# Patient Record
Sex: Female | Born: 1985 | Race: White | Hispanic: No | Marital: Single | Smoking: Current every day smoker
Health system: Southern US, Community
[De-identification: ages and names within clinical notes are randomized; demographics above are authoritative.]

## PROBLEM LIST (undated history)

## (undated) DIAGNOSIS — F4310 Post-traumatic stress disorder, unspecified: Secondary | ICD-10-CM

## (undated) DIAGNOSIS — Z87442 Personal history of urinary calculi: Secondary | ICD-10-CM

## (undated) DIAGNOSIS — N200 Calculus of kidney: Secondary | ICD-10-CM

## (undated) DIAGNOSIS — K5792 Diverticulitis of intestine, part unspecified, without perforation or abscess without bleeding: Secondary | ICD-10-CM

## (undated) DIAGNOSIS — F411 Generalized anxiety disorder: Secondary | ICD-10-CM

## (undated) HISTORY — DX: Diverticulitis of intestine, part unspecified, without perforation or abscess without bleeding: K57.92

## (undated) HISTORY — DX: Post-traumatic stress disorder, unspecified: F43.10

## (undated) HISTORY — DX: Generalized anxiety disorder: F41.1

## (undated) HISTORY — DX: Calculus of kidney: N20.0

---
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
## 2011-03-18 ENCOUNTER — Emergency Department: Payer: Self-pay | Admitting: Emergency Medicine

## 2011-03-18 LAB — URINALYSIS, COMPLETE
Bacteria: NONE SEEN
Ketone: NEGATIVE
Leukocyte Esterase: NEGATIVE
Nitrite: NEGATIVE
Protein: NEGATIVE
Specific Gravity: 1.02 (ref 1.003–1.030)
WBC UR: 1 /HPF (ref 0–5)

## 2011-03-19 LAB — CBC
HCT: 40.7 % (ref 35.0–47.0)
HGB: 13.6 g/dL (ref 12.0–16.0)
MCH: 31.1 pg (ref 26.0–34.0)
MCV: 93 fL (ref 80–100)
Platelet: 270 10*3/uL (ref 150–440)
RBC: 4.39 10*6/uL (ref 3.80–5.20)
RDW: 12.9 % (ref 11.5–14.5)

## 2011-03-24 ENCOUNTER — Emergency Department: Payer: Self-pay | Admitting: Emergency Medicine

## 2011-03-24 LAB — URINALYSIS, COMPLETE
Bilirubin,UR: NEGATIVE
Ketone: NEGATIVE
Leukocyte Esterase: NEGATIVE
Ph: 8 (ref 4.5–8.0)
RBC,UR: 2 /HPF (ref 0–5)
Specific Gravity: 1.004 (ref 1.003–1.030)
Squamous Epithelial: 2

## 2011-03-24 LAB — COMPREHENSIVE METABOLIC PANEL
Albumin: 4.1 g/dL (ref 3.4–5.0)
Alkaline Phosphatase: 56 U/L (ref 50–136)
BUN: 6 mg/dL — ABNORMAL LOW (ref 7–18)
Bilirubin,Total: 0.3 mg/dL (ref 0.2–1.0)
Calcium, Total: 9.3 mg/dL (ref 8.5–10.1)
Co2: 26 mmol/L (ref 21–32)
EGFR (African American): >60
Glucose: 89 mg/dL (ref 65–99)
Potassium: 3.6 mmol/L (ref 3.5–5.1)
SGOT(AST): 10 U/L — ABNORMAL LOW (ref 15–37)
Sodium: 138 mmol/L (ref 136–145)

## 2011-03-24 LAB — CBC
MCH: 31 pg (ref 26.0–34.0)
MCHC: 33.7 g/dL (ref 32.0–36.0)
MCV: 92 fL (ref 80–100)
Platelet: 227 10*3/uL (ref 150–440)
RBC: 4.11 10*6/uL (ref 3.80–5.20)
RDW: 13.1 % (ref 11.5–14.5)
WBC: 8.4 10*3/uL (ref 3.6–11.0)

## 2011-03-24 LAB — RAPID INFLUENZA A&B ANTIGENS

## 2011-03-24 LAB — LIPASE, BLOOD: Lipase: 83 U/L (ref 73–393)

## 2011-07-15 ENCOUNTER — Emergency Department: Payer: Self-pay

## 2016-09-06 ENCOUNTER — Encounter: Payer: Self-pay | Admitting: Emergency Medicine

## 2016-09-06 ENCOUNTER — Emergency Department
Admission: EM | Admit: 2016-09-06 | Discharge: 2016-09-06 | Disposition: A | Discharge status: Home / Self Care | Payer: Medicaid Other | Attending: Emergency Medicine | Admitting: Emergency Medicine

## 2016-09-06 DIAGNOSIS — T7840XA Allergy, unspecified, initial encounter: Secondary | ICD-10-CM | POA: Insufficient documentation

## 2016-09-06 DIAGNOSIS — Z888 Allergy status to other drugs, medicaments and biological substances status: Secondary | ICD-10-CM

## 2016-09-06 DIAGNOSIS — Z882 Allergy status to sulfonamides status: Secondary | ICD-10-CM | POA: Insufficient documentation

## 2016-09-06 DIAGNOSIS — F17200 Nicotine dependence, unspecified, uncomplicated: Secondary | ICD-10-CM | POA: Insufficient documentation

## 2016-09-06 DIAGNOSIS — L509 Urticaria, unspecified: Secondary | ICD-10-CM | POA: Diagnosis not present

## 2016-09-06 MED ORDER — DIPHENHYDRAMINE HCL 25 MG PO CAPS
ORAL_CAPSULE | ORAL | Status: AC
  Filled 2016-09-06: qty 2

## 2016-09-06 MED ORDER — FAMOTIDINE 20 MG PO TABS: 20.0000 mg | ORAL_TABLET | Freq: Two times a day (BID) | ORAL | 1 refills | Status: DC

## 2016-09-06 MED ORDER — EPINEPHRINE 0.3 MG/0.3ML IJ SOAJ: 0.3000 mg | Freq: Once | INTRAMUSCULAR | 0 refills | Status: AC

## 2016-09-06 MED ORDER — DIPHENHYDRAMINE HCL 25 MG PO CAPS
50.0000 mg | ORAL_CAPSULE | Freq: Once | ORAL | Status: AC
  Administered 2016-09-06: 50 mg via ORAL

## 2016-09-06 MED ORDER — PREDNISONE 20 MG PO TABS: 60.0000 mg | ORAL_TABLET | Freq: Every day | ORAL | 0 refills | Status: DC

## 2016-09-06 MED ORDER — METHYLPREDNISOLONE SODIUM SUCC 125 MG IJ SOLR
125.0000 mg | Freq: Once | INTRAMUSCULAR | Status: AC
  Administered 2016-09-06: 125 mg via INTRAVENOUS
  Filled 2016-09-06: qty 2

## 2016-09-06 MED ORDER — FAMOTIDINE IN NACL 20-0.9 MG/50ML-% IV SOLN
20.0000 mg | Freq: Once | INTRAVENOUS | Status: AC
  Administered 2016-09-06: 20 mg via INTRAVENOUS
  Filled 2016-09-06: qty 50

## 2016-09-06 NOTE — ED Triage Notes (Signed)
Pt with rash to all over since yesterday, states is burning and itching. Started on some new meds two weeks ago, bactrim, buspar, trazadone, kenelog and mupirocin. Has been using benedryl with no relief. No resp distress noted.

## 2016-09-06 NOTE — ED Provider Notes (Addendum)
[facility] Emergency Department Provider Note  ____________________________________________   I have reviewed the triage vital signs and the nursing notes.   HISTORY  Chief Complaint Allergic Reaction and Rash    HPI Toni Salazar is a 31 y.o. female was loosely started on a large number of new medications including Kenalog, mupirocin, Bactrim, trazodone, BuSpar,who is been all of all of these medications with intermittent frequency for 2 weeks. She had a possible infection on her hands she was being treated with Bactrim. Last took Bactrim yesterday. Yesterday she began having a rash which was hives diffusely. She denies any tongue swelling or respiratory issues. No throat closure or etc. She is using the trazodone daily at bedtime, she is taking the BuSpar as needed for anxiety and hasn't taken it recently, Kenalog she is not recently used and the mupirocin she is not recently as she has been consistently taking the Bactrim however. Patient does have a history of allergic reactions to penicillin with hives as well.   History reviewed. No pertinent past medical history.  There are no active problems to display for this patient.   History reviewed. No pertinent surgical history.  Prior to Admission medications   Not on File    Allergies Amoxicillin  No family history on file.  Social History Social History  Substance Use Topics  . Smoking status: Current Every Day Smoker  . Smokeless tobacco: Never Used  . Alcohol use Yes    Review of Systems Constitutional: No fever/chills Eyes: No visual changes. ENT: No sore throat. No stiff neck no neck pain Cardiovascular: Denies chest pain. Respiratory: Denies shortness of breath. Gastrointestinal:   no vomiting.  No diarrhea.  No constipation. Genitourinary: Negative for dysuria. Musculoskeletal: Negative lower extremity swelling Skin: Positive for rash. Neurological: Negative for severe headaches,  focal weakness or numbness.   ____________________________________________   PHYSICAL EXAM:  VITAL SIGNS: ED Triage Vitals [09/06/16 1002]  Enc Vitals Group     BP (!) 103/49     Pulse Rate (!) 118     Resp 20     Temp 99.2 F (37.3 C)     Temp Source Oral     SpO2 97 %     Weight      Height      Head Circumference      Peak Flow      Pain Score 6     Pain Loc      Pain Edu?      Excl. in GC?     Constitutional: Alert and oriented. Well appearing and in no acute distress.Anxious and upset Eyes: Conjunctivae are normal Head: Atraumatic HEENT: No congestion/rhinnorhea. Mucous membranes are moist.  Oropharynx non-erythematous Neck:   Nontender with no meningismus, no masses, no stridor Cardiovascular: Normal rate, regular rhythm. Grossly normal heart sounds.  Good peripheral circulation. Respiratory: Normal respiratory effort.  No retractions. Lungs CTAB. Abdominal: Soft and nontender. No distention. No guarding no rebound Back:  There is no focal tenderness or step off.  there is no midline tenderness there are no lesions noted. there is no CVA tenderness Musculoskeletal: No lower extremity tenderness, no upper extremity tenderness. No joint effusions, no DVT signs strong distal pulses no edema Neurologic:  Normal speech and language. No gross focal neurologic deficits are appreciated.  Skin:  Skin is warm, dry and intact. Diffuse hives noted Psychiatric: Mood and affect are normal. Speech and behavior are normal.  ____________________________________________   LABS (all labs ordered  are listed, but only abnormal results are displayed)  Labs Reviewed - No data to display ____________________________________________  EKG  I personally interpreted any EKGs ordered by me or triage  ____________________________________________  RADIOLOGY  I reviewed any imaging ordered by me or triage that were performed during my shift and, if possible, patient and/or family made  aware of any abnormal findings. ____________________________________________   PROCEDURES  Procedure(s) performed: None  Procedures  Critical Care performed: None  ____________________________________________   INITIAL IMPRESSION / ASSESSMENT AND PLAN / ED COURSE  Pertinent labs & imaging results that were available during my care of the patient were reviewed by me and considered in my medical decision making (see chart for details).  Patient here with hives after starting a large number of new medications be most likely this is the Bactrim, however she is also taking trazodone daily at bedtime and BuSpar when necessary anxiety. Hasn't taken it was primed several days however. We will advise that she stop these new medications and follow closely with her doctor we'll treat her with steroids and antihistamines and we'll reassess.   ----------------------------------------- 2:41 PM on 09/06/2016 -----------------------------------------  All symptoms have resolved patient has no evidence of progression no evidence of anaphylaxis we will have her follow closely with her doctor and I have recommended follow up also with an allergist. Patient in no acute distress. We have advised that she stop all new medications. Most likely this is the Bactrim she is understanding that she has a sulfa allergy but I have advised her to stop all new medications until she can see her doctor and possibly have some reintroduced as needed. None of these are essential medications. Patient's very understanding and in agreement with this plan.    ____________________________________________   FINAL CLINICAL IMPRESSION(S) / ED DIAGNOSES  Final diagnoses:  None      This chart was dictated using voice recognition software.  Despite best efforts to proofread,  errors can occur which can change meaning.      Jeanmarie PlantMcShane, James A, MD 09/06/16 1224    Jeanmarie PlantMcShane, James A, MD 09/06/16 [phone]

## 2016-09-06 NOTE — ED Notes (Addendum)
Pt has broken out in hives all over her body (started Saturday) - she started new medication 2 weeks ago (Bactrim DS, Buspar, and trazodone) - pt c/o itching - pt appears anxious but is no acute respiratory distress - no cough noted and respirations are even and unlabored

## 2016-09-06 NOTE — Discharge Instructions (Signed)
Most likely this is a reaction to the sulfa antibiotic, you are now officially allergic to sulfa antibiotics until proven otherwise. However, we cannot know for sure if you're allergic to any of the other new medications he started including BuSpar, trazodone, and those creams. Any new medications that you have had the last week he must stop taking until he can see her doctor. We would strongly advise you to follow closely with your primary care doctor tomorrow as well as follow closely with an allergist by their recommendation.

## 2017-04-28 ENCOUNTER — Encounter: Payer: Self-pay | Admitting: Emergency Medicine

## 2017-04-28 ENCOUNTER — Other Ambulatory Visit: Payer: Self-pay

## 2017-04-28 ENCOUNTER — Observation Stay
Admission: EM | Admit: 2017-04-28 | Discharge: 2017-04-30 | Disposition: A | Discharge status: Home / Self Care | Payer: Medicaid Other | Attending: Urology | Admitting: Urology

## 2017-04-28 ENCOUNTER — Emergency Department: Payer: Medicaid Other

## 2017-04-28 DIAGNOSIS — N200 Calculus of kidney: Secondary | ICD-10-CM | POA: Diagnosis present

## 2017-04-28 DIAGNOSIS — Z882 Allergy status to sulfonamides status: Secondary | ICD-10-CM | POA: Diagnosis not present

## 2017-04-28 DIAGNOSIS — R319 Hematuria, unspecified: Secondary | ICD-10-CM

## 2017-04-28 DIAGNOSIS — N390 Urinary tract infection, site not specified: Secondary | ICD-10-CM | POA: Diagnosis not present

## 2017-04-28 DIAGNOSIS — Z880 Allergy status to penicillin: Secondary | ICD-10-CM | POA: Diagnosis not present

## 2017-04-28 DIAGNOSIS — N201 Calculus of ureter: Secondary | ICD-10-CM | POA: Diagnosis present

## 2017-04-28 DIAGNOSIS — N132 Hydronephrosis with renal and ureteral calculous obstruction: Secondary | ICD-10-CM | POA: Diagnosis not present

## 2017-04-28 DIAGNOSIS — F17200 Nicotine dependence, unspecified, uncomplicated: Secondary | ICD-10-CM | POA: Insufficient documentation

## 2017-04-28 DIAGNOSIS — R109 Unspecified abdominal pain: Secondary | ICD-10-CM

## 2017-04-28 LAB — COMPREHENSIVE METABOLIC PANEL
ALK PHOS: 80 U/L (ref 38–126)
ALT: 19 U/L (ref 14–54)
AST: 21 U/L (ref 15–41)
Albumin: 4.5 g/dL (ref 3.5–5.0)
Anion gap: 10 (ref 5–15)
BUN: 17 mg/dL (ref 6–20)
CALCIUM: 9.2 mg/dL (ref 8.9–10.3)
CO2: 24 mmol/L (ref 22–32)
Chloride: 105 mmol/L (ref 101–111)
Creatinine, Ser: 0.85 mg/dL (ref 0.44–1.00)
GFR calc Af Amer: >60 mL/min (ref >60)
GFR calc non Af Amer: >60 mL/min (ref >60)
Glucose, Bld: 126 mg/dL — ABNORMAL HIGH (ref 65–99)
Potassium: 3.9 mmol/L (ref 3.5–5.1)
SODIUM: 139 mmol/L (ref 135–145)
TOTAL PROTEIN: 7.9 g/dL (ref 6.5–8.1)
Total Bilirubin: 0.9 mg/dL (ref 0.3–1.2)

## 2017-04-28 LAB — CBC WITH DIFFERENTIAL/PLATELET
Basophils Absolute: 0.1 10*3/uL (ref 0–0.1)
Basophils Relative: 0 %
EOS ABS: 0.1 10*3/uL (ref 0–0.7)
EOS PCT: 0 %
HCT: 42.6 % (ref 35.0–47.0)
HEMOGLOBIN: 14.2 g/dL (ref 12.0–16.0)
LYMPHS ABS: 1.7 10*3/uL (ref 1.0–3.6)
Lymphocytes Relative: 9 %
MCH: 30.3 pg (ref 26.0–34.0)
MCHC: 33.4 g/dL (ref 32.0–36.0)
MCV: 90.5 fL (ref 80.0–100.0)
MONO ABS: 1.3 10*3/uL — AB (ref 0.2–0.9)
Monocytes Relative: 6 %
Neutro Abs: 17.5 10*3/uL — ABNORMAL HIGH (ref 1.4–6.5)
Neutrophils Relative %: 85 %
PLATELETS: 309 10*3/uL (ref 150–440)
RBC: 4.71 MIL/uL (ref 3.80–5.20)
RDW: 13.9 % (ref 11.5–14.5)
WBC: 20.6 10*3/uL — ABNORMAL HIGH (ref 3.6–11.0)

## 2017-04-28 LAB — POCT PREGNANCY, URINE: PREG TEST UR: NEGATIVE

## 2017-04-28 LAB — LIPASE, BLOOD: Lipase: 22 U/L (ref 11–51)

## 2017-04-28 LAB — HCG, QUANTITATIVE, PREGNANCY: hCG, Beta Chain, Quant, S: <1 m[IU]/mL (ref <5)

## 2017-04-28 MED ORDER — KETOROLAC TROMETHAMINE 30 MG/ML IJ SOLN
30.0000 mg | Freq: Once | INTRAMUSCULAR | Status: AC
  Administered 2017-04-28: 30 mg via INTRAVENOUS
  Filled 2017-04-28: qty 1

## 2017-04-28 MED ORDER — SODIUM CHLORIDE 0.9 % IV BOLUS (SEPSIS)
1000.0000 mL | Freq: Once | INTRAVENOUS | Status: AC
  Administered 2017-04-28: 1000 mL via INTRAVENOUS

## 2017-04-28 MED ORDER — ONDANSETRON HCL 4 MG/2ML IJ SOLN
4.0000 mg | Freq: Once | INTRAMUSCULAR | Status: AC
  Administered 2017-04-28: 4 mg via INTRAVENOUS
  Filled 2017-04-28: qty 2

## 2017-04-28 MED ORDER — MORPHINE SULFATE (PF) 4 MG/ML IV SOLN
4.0000 mg | Freq: Once | INTRAVENOUS | Status: AC
  Administered 2017-04-28: 4 mg via INTRAVENOUS
  Filled 2017-04-28: qty 1

## 2017-04-28 NOTE — ED Provider Notes (Signed)
[facility] Emergency Department Provider Note  ____________________________________________   First MD Initiated Contact with Patient 04/28/17 2210     (approximate)  I have reviewed the triage vital signs and the nursing notes.   HISTORY  Chief Complaint Flank Pain   HPI Toni Salazar is a 32 y.o. female with a history of "painful."  Was presented to the emergency department with right-sided flank pain since 430 this afternoon.  The patient reports that the pain is a 10 out of 10 and sharp at this time.  She says that the pain is coming and going.  Says that she has vomited secondary to the pain.  Says that she is a family history of kidney stones but is never had a kidney stone herself.  Says that she also feels like she has to urinate but is unable to.  Denies any abdominal pain.  Says that she is currently on her period.  However, she says that her period pains are usually not as intense as she is experiencing at this time.  History reviewed. No pertinent past medical history.  There are no active problems to display for this patient.   Past Surgical History:  Procedure Laterality Date  . CESAREAN SECTION      Prior to Admission medications   Medication Sig Start Date End Date Taking? Authorizing Provider  famotidine (PEPCID) 20 MG tablet Take 1 tablet (20 mg total) by mouth 2 (two) times daily. 09/06/16 09/06/17  Jeanmarie PlantMcShane, James A, MD  predniSONE (DELTASONE) 20 MG tablet Take 3 tablets (60 mg total) by mouth daily. 09/06/16   Jeanmarie PlantMcShane, James A, MD    Allergies Amoxicillin and Sulfa antibiotics  No family history on file.  Social History Social History   Tobacco Use  . Smoking status: Current Every Day Smoker  . Smokeless tobacco: Never Used  Substance Use Topics  . Alcohol use: Yes  . Drug use: No    Review of Systems  Constitutional: No fever/chills Eyes: No visual changes. ENT: No sore throat. Cardiovascular: Denies chest  pain. Respiratory: Denies shortness of breath. Gastrointestinal: No abdominal pain.  No nausea, no vomiting.  No diarrhea.  No constipation. Genitourinary: Negative for dysuria. Musculoskeletal: As above Skin: Negative for rash. Neurological: Negative for headaches, focal weakness or numbness.   ____________________________________________   PHYSICAL EXAM:  VITAL SIGNS: ED Triage Vitals  Enc Vitals Group     BP 04/28/17 2204 117/89     Pulse Rate 04/28/17 2204 88     Resp 04/28/17 2204 (!) 22     Temp 04/28/17 2204 98 F (36.7 C)     Temp Source 04/28/17 2204 Oral     SpO2 04/28/17 2204 98 %     Weight 04/28/17 2203 190 lb (86.2 kg)     Height 04/28/17 2203 5\' 2"  (1.575 m)     Head Circumference --      Peak Flow --      Pain Score 04/28/17 2203 10     Pain Loc --      Pain Edu? --      Excl. in GC? --     Constitutional: Alert and oriented.  Patient hunched over the end of the bed.  She is standing on the floor with her arm outstretched on top of the mattress.  She appears very uncomfortable. Eyes: Conjunctivae are normal.  Head: Atraumatic. Nose: No congestion/rhinnorhea. Mouth/Throat: Mucous membranes are moist.  Neck: No stridor.   Cardiovascular: Normal rate,  regular rhythm. Grossly normal heart sounds.   Respiratory: Normal respiratory effort.  No retractions. Lungs CTAB. Gastrointestinal: Soft and nontender. No distention.  Minimal right-sided CVA tenderness to palpation. Musculoskeletal: No lower extremity tenderness nor edema.  No joint effusions. Neurologic:  Normal speech and language. No gross focal neurologic deficits are appreciated. Skin:  Skin is warm, dry and intact. No rash noted.  ____________________________________________   LABS (all labs ordered are listed, but only abnormal results are displayed)  Labs Reviewed  CBC WITH DIFFERENTIAL/PLATELET - Abnormal; Notable for the following components:      Result Value   WBC 20.6 (*)    Neutro Abs  17.5 (*)    Monocytes Absolute 1.3 (*)    All other components within normal limits  COMPREHENSIVE METABOLIC PANEL - Abnormal; Notable for the following components:   Glucose, Bld 126 (*)    All other components within normal limits  LIPASE, BLOOD  HCG, QUANTITATIVE, PREGNANCY  URINALYSIS, COMPLETE (UACMP) WITH MICROSCOPIC  POC URINE PREG, ED   ____________________________________________  EKG   ____________________________________________  RADIOLOGY  Pending CT renal ____________________________________________   PROCEDURES  Procedure(s) performed:   Procedures  Critical Care performed:   ____________________________________________   INITIAL IMPRESSION / ASSESSMENT AND PLAN / ED COURSE  Pertinent labs & imaging results that were available during my care of the patient were reviewed by me and considered in my medical decision making (see chart for details).  Differential diagnosis includes, but is not limited to, ovarian cyst, ovarian torsion, acute appendicitis, diverticulitis, urinary tract infection/pyelonephritis, endometriosis, bowel obstruction, colitis, renal colic, gastroenteritis, hernia, fibroids, endometriosis, pregnancy related pain including ectopic pregnancy, etc.  As part of my medical decision making, I reviewed the following data within the electronic MEDICAL RECORD NUMBER Notes from prior ED visits  ----------------------------------------- 11:59 PM on 04/28/2017 -----------------------------------------  Patient resting without distress at this time after IV pain meds.  Pending CT scan at this time as well as urinalysis.  Signed out to Dr. Derrill Kay.      ____________________________________________   FINAL CLINICAL IMPRESSION(S) / ED DIAGNOSES  Right flank pain.    NEW MEDICATIONS STARTED DURING THIS VISIT:  New Prescriptions   No medications on file     Note:  This document was prepared using Dragon voice recognition software and may  include unintentional dictation errors.     Myrna Blazer, MD 04/28/17 [phone]

## 2017-04-28 NOTE — ED Notes (Signed)
Patient transported to CT 

## 2017-04-28 NOTE — ED Triage Notes (Addendum)
Patient ambulatory to triage with steady gait, without difficulty, pt restless, tearful; pt reports right flank/abd pain this evening; currently menstruating with urinary urgency; denies hx of same

## 2017-04-29 ENCOUNTER — Emergency Department: Payer: Medicaid Other | Admitting: Anesthesiology

## 2017-04-29 ENCOUNTER — Encounter
Admission: EM | Disposition: A | Discharge status: Home / Self Care | Payer: Self-pay | Source: Home / Self Care | Attending: Emergency Medicine

## 2017-04-29 ENCOUNTER — Encounter: Payer: Self-pay | Admitting: Anesthesiology

## 2017-04-29 ENCOUNTER — Other Ambulatory Visit: Payer: Self-pay

## 2017-04-29 DIAGNOSIS — N201 Calculus of ureter: Secondary | ICD-10-CM

## 2017-04-29 DIAGNOSIS — R319 Hematuria, unspecified: Secondary | ICD-10-CM

## 2017-04-29 DIAGNOSIS — N390 Urinary tract infection, site not specified: Secondary | ICD-10-CM

## 2017-04-29 DIAGNOSIS — N132 Hydronephrosis with renal and ureteral calculous obstruction: Secondary | ICD-10-CM

## 2017-04-29 HISTORY — PX: CYSTOSCOPY WITH STENT PLACEMENT: SHX5790

## 2017-04-29 HISTORY — DX: Calculus of ureter: N20.1

## 2017-04-29 LAB — URINALYSIS, COMPLETE (UACMP) WITH MICROSCOPIC
Bacteria, UA: NONE SEEN
Bilirubin Urine: NEGATIVE
GLUCOSE, UA: NEGATIVE mg/dL
Ketones, ur: 5 mg/dL — AB
Leukocytes, UA: NEGATIVE
Nitrite: NEGATIVE
PH: 5 (ref 5.0–8.0)
PROTEIN: 30 mg/dL — AB
Specific Gravity, Urine: 1.024 (ref 1.005–1.030)

## 2017-04-29 SURGERY — CYSTOSCOPY, WITH STENT INSERTION
Anesthesia: General | Site: Ureter | Laterality: Right | Wound class: Clean Contaminated

## 2017-04-29 MED ORDER — FENTANYL CITRATE (PF) 100 MCG/2ML IJ SOLN
INTRAMUSCULAR | Status: DC | PRN
  Administered 2017-04-29 (×2): 50 ug via INTRAVENOUS

## 2017-04-29 MED ORDER — FENTANYL CITRATE (PF) 100 MCG/2ML IJ SOLN: 25.0000 ug | INTRAMUSCULAR | Status: DC | PRN

## 2017-04-29 MED ORDER — OXYCODONE-ACETAMINOPHEN 5-325 MG PO TABS
1.0000 | ORAL_TABLET | ORAL | Status: DC | PRN
  Administered 2017-04-29: 1 via ORAL
  Administered 2017-04-29 (×2): 2 via ORAL
  Administered 2017-04-29: 1 via ORAL
  Filled 2017-04-29 (×2): qty 1
  Filled 2017-04-29 (×2): qty 2

## 2017-04-29 MED ORDER — OXYBUTYNIN CHLORIDE 5 MG PO TABS: 5.0000 mg | ORAL_TABLET | Freq: Three times a day (TID) | ORAL | Status: DC | PRN

## 2017-04-29 MED ORDER — GENTAMICIN SULFATE 40 MG/ML IJ SOLN
5.0000 mg/kg | Freq: Once | INTRAVENOUS | Status: AC
  Administered 2017-04-30: 320 mg via INTRAVENOUS
  Filled 2017-04-29: qty 8

## 2017-04-29 MED ORDER — OXYBUTYNIN CHLORIDE 5 MG PO TABS: 5.0000 mg | ORAL_TABLET | Freq: Three times a day (TID) | ORAL | 0 refills | Status: DC | PRN

## 2017-04-29 MED ORDER — MORPHINE SULFATE (PF) 4 MG/ML IV SOLN
4.0000 mg | Freq: Once | INTRAVENOUS | Status: AC
  Administered 2017-04-29: 4 mg via INTRAVENOUS
  Filled 2017-04-29: qty 1

## 2017-04-29 MED ORDER — HYDROCODONE-ACETAMINOPHEN 5-325 MG PO TABS: 1.0000 | ORAL_TABLET | Freq: Four times a day (QID) | ORAL | 0 refills | Status: DC | PRN

## 2017-04-29 MED ORDER — TAMSULOSIN HCL 0.4 MG PO CAPS: 0.4000 mg | ORAL_CAPSULE | Freq: Every day | ORAL | 0 refills | Status: DC

## 2017-04-29 MED ORDER — BELLADONNA ALKALOIDS-OPIUM 16.2-60 MG RE SUPP: 1.0000 | Freq: Four times a day (QID) | RECTAL | Status: DC | PRN

## 2017-04-29 MED ORDER — SODIUM CHLORIDE 0.9 % IV SOLN
INTRAVENOUS | Status: DC | PRN
  Administered 2017-04-29: 02:00:00 via INTRAVENOUS

## 2017-04-29 MED ORDER — DOCUSATE SODIUM 100 MG PO CAPS: 100.0000 mg | ORAL_CAPSULE | Freq: Two times a day (BID) | ORAL | 0 refills | Status: DC

## 2017-04-29 MED ORDER — ACETAMINOPHEN 325 MG PO TABS: 650.0000 mg | ORAL_TABLET | ORAL | Status: DC | PRN

## 2017-04-29 MED ORDER — LIDOCAINE HCL (CARDIAC) 20 MG/ML IV SOLN
INTRAVENOUS | Status: DC | PRN
  Administered 2017-04-29: 50 mg via INTRAVENOUS

## 2017-04-29 MED ORDER — DIPHENHYDRAMINE HCL 12.5 MG/5ML PO ELIX
12.5000 mg | ORAL_SOLUTION | Freq: Four times a day (QID) | ORAL | Status: DC | PRN
  Filled 2017-04-29: qty 10

## 2017-04-29 MED ORDER — MIDAZOLAM HCL 2 MG/2ML IJ SOLN
INTRAMUSCULAR | Status: DC | PRN
  Administered 2017-04-29: 2 mg via INTRAVENOUS

## 2017-04-29 MED ORDER — ONDANSETRON HCL 4 MG/2ML IJ SOLN
INTRAMUSCULAR | Status: DC | PRN
  Administered 2017-04-29: 4 mg via INTRAVENOUS

## 2017-04-29 MED ORDER — MORPHINE SULFATE (PF) 2 MG/ML IV SOLN: 2.0000 mg | INTRAVENOUS | Status: DC | PRN

## 2017-04-29 MED ORDER — IOTHALAMATE MEGLUMINE 43 % IV SOLN
INTRAVENOUS | Status: DC | PRN
  Administered 2017-04-29: 9 mL via URETHRAL

## 2017-04-29 MED ORDER — PROPOFOL 10 MG/ML IV BOLUS
INTRAVENOUS | Status: DC | PRN
  Administered 2017-04-29: 200 mg via INTRAVENOUS

## 2017-04-29 MED ORDER — CIPROFLOXACIN HCL 500 MG PO TABS: 500.0000 mg | ORAL_TABLET | Freq: Two times a day (BID) | ORAL | 0 refills | Status: DC

## 2017-04-29 MED ORDER — PROPOFOL 10 MG/ML IV BOLUS
INTRAVENOUS | Status: AC
  Filled 2017-04-29: qty 20

## 2017-04-29 MED ORDER — ONDANSETRON HCL 4 MG/2ML IJ SOLN: 4.0000 mg | Freq: Once | INTRAMUSCULAR | Status: DC | PRN

## 2017-04-29 MED ORDER — DEXAMETHASONE SODIUM PHOSPHATE 10 MG/ML IJ SOLN
INTRAMUSCULAR | Status: DC | PRN
  Administered 2017-04-29: 10 mg via INTRAVENOUS

## 2017-04-29 MED ORDER — CIPROFLOXACIN IN D5W 400 MG/200ML IV SOLN
400.0000 mg | Freq: Once | INTRAVENOUS | Status: AC
  Administered 2017-04-29: 400 mg via INTRAVENOUS
  Filled 2017-04-29: qty 200

## 2017-04-29 MED ORDER — PROPOFOL 500 MG/50ML IV EMUL
INTRAVENOUS | Status: AC
  Filled 2017-04-29: qty 50

## 2017-04-29 MED ORDER — HEPARIN SODIUM (PORCINE) 5000 UNIT/ML IJ SOLN
5000.0000 [IU] | Freq: Three times a day (TID) | INTRAMUSCULAR | Status: DC
  Filled 2017-04-29: qty 1

## 2017-04-29 MED ORDER — SODIUM CHLORIDE 0.9 % IV SOLN
INTRAVENOUS | Status: DC
  Administered 2017-04-29 – 2017-04-30 (×4): via INTRAVENOUS

## 2017-04-29 MED ORDER — GENTAMICIN SULFATE 40 MG/ML IJ SOLN
5.0000 mg/kg | Freq: Once | INTRAVENOUS | Status: AC
  Administered 2017-04-29: 320 mg via INTRAVENOUS
  Filled 2017-04-29: qty 8

## 2017-04-29 MED ORDER — SENNOSIDES-DOCUSATE SODIUM 8.6-50 MG PO TABS: 1.0000 | ORAL_TABLET | Freq: Every evening | ORAL | Status: DC | PRN

## 2017-04-29 MED ORDER — SUCCINYLCHOLINE CHLORIDE 20 MG/ML IJ SOLN
INTRAMUSCULAR | Status: DC | PRN
  Administered 2017-04-29: 140 mg via INTRAVENOUS

## 2017-04-29 MED ORDER — FENTANYL CITRATE (PF) 100 MCG/2ML IJ SOLN
INTRAMUSCULAR | Status: AC
  Filled 2017-04-29: qty 2

## 2017-04-29 MED ORDER — DIPHENHYDRAMINE HCL 50 MG/ML IJ SOLN: 12.5000 mg | Freq: Four times a day (QID) | INTRAMUSCULAR | Status: DC | PRN

## 2017-04-29 MED ORDER — ONDANSETRON HCL 4 MG/2ML IJ SOLN: 4.0000 mg | INTRAMUSCULAR | Status: DC | PRN

## 2017-04-29 MED ORDER — DOCUSATE SODIUM 100 MG PO CAPS
100.0000 mg | ORAL_CAPSULE | Freq: Two times a day (BID) | ORAL | Status: DC
  Administered 2017-04-29 (×2): 100 mg via ORAL
  Filled 2017-04-29 (×2): qty 1

## 2017-04-29 MED ORDER — MIDAZOLAM HCL 2 MG/2ML IJ SOLN
INTRAMUSCULAR | Status: AC
  Filled 2017-04-29: qty 2

## 2017-04-29 SURGICAL SUPPLY — 22 items
BAG DRAIN CYSTO-URO LG1000N (MISCELLANEOUS) ×3 IMPLANT
BRUSH SCRUB EZ  4% CHG (MISCELLANEOUS) ×2
BRUSH SCRUB EZ 4% CHG (MISCELLANEOUS) ×1 IMPLANT
CATH URETL 5X70 OPEN END (CATHETERS) ×3 IMPLANT
CONRAY 43 FOR UROLOGY 50M (MISCELLANEOUS) ×3 IMPLANT
GLOVE BIO SURGEON STRL SZ 6.5 (GLOVE) ×8 IMPLANT
GLOVE BIO SURGEONS STRL SZ 6.5 (GLOVE) ×4
GOWN STRL REUS W/ TWL LRG LVL3 (GOWN DISPOSABLE) ×2 IMPLANT
GOWN STRL REUS W/TWL LRG LVL3 (GOWN DISPOSABLE) ×4
KIT TURNOVER CYSTO (KITS) ×3 IMPLANT
PACK CYSTO AR (MISCELLANEOUS) ×3 IMPLANT
SENSORWIRE 0.038 NOT ANGLED (WIRE) ×3
SET CYSTO W/LG BORE CLAMP LF (SET/KITS/TRAYS/PACK) ×3 IMPLANT
SOL .9 NS 3000ML IRR  AL (IV SOLUTION) ×2
SOL .9 NS 3000ML IRR UROMATIC (IV SOLUTION) ×1 IMPLANT
STENT URET 6FRX22 CONTOUR (STENTS) ×3 IMPLANT
STENT URET 6FRX24 CONTOUR (STENTS) IMPLANT
STENT URET 6FRX26 CONTOUR (STENTS) IMPLANT
SURGILUBE 2OZ TUBE FLIPTOP (MISCELLANEOUS) ×3 IMPLANT
SYRINGE IRR TOOMEY STRL 70CC (SYRINGE) ×3 IMPLANT
WATER STERILE IRR 1000ML POUR (IV SOLUTION) ×3 IMPLANT
WIRE SENSOR 0.038 NOT ANGLED (WIRE) ×1 IMPLANT

## 2017-04-29 NOTE — Op Note (Signed)
Date of procedure: 04/29/17  Preoperative diagnosis:  1. Right UVJ stone 2. Right hydronephrosis 3. UTI  Postoperative diagnosis:  1. Same as above  Procedure: 1. Cystoscopy 2. Right retrograde pyelogram 3. Right ureteral stent placement  Surgeon: Vanna ScotlandAshley Dewanna Hurston, MD  Anesthesia: General  Complications: None  Intraoperative findings: Hydroureteronephrosis down to the level of the UVJ and retrograde pyelogram.  Stent placed without difficulty.  EBL: minmal  Specimens: None  Drains: 6 x 22 French double-J ureteral stent on right  Indication: Toni Salazar is a 32 y.o. patient with an obstructing right UVJ stone, leukocytosis, and positive urinalysis all concerning for infection.  She was counseled to undergo emergent ureteral stent placement..  After reviewing the management options for treatment, she elected to proceed with the above surgical procedure(s). We have discussed the potential benefits and risks of the procedure, side effects of the proposed treatment, the likelihood of the patient achieving the goals of the procedure, and any potential problems that might occur during the procedure or recuperation. Informed consent has been obtained.  Description of procedure:  The patient was taken to the operating room and general anesthesia was induced.  The patient was placed in the dorsal lithotomy position, prepped and draped in the usual sterile fashion, and preoperative antibiotics were administered. A preoperative time-out was performed.   A 21 French scope was advanced per urethra into the bladder.  Attention was turned to the right UVJ which is noted to be mildly 5 [place]French open-ended ureteral catheter was advanced just within the mouth of the UO and a very gentle retrograde pyelogram was performed using only approximately 8 cc of contrast.  This revealed hydroureteronephrosis with a transition point at the UVJ.  The stone itself was not easily seen on scout imaging.  A sensor  wire was then advanced up to level of the kidney.  A 6 x 22 French double-J ureteral stent was then advanced over the wire to the level of the kidney.  The wire was partially drawn and a full coils noted within the renal pelvis.  The wire was then fully withdrawn a focal was noted within the bladder.  The bladder was then drained.  She was then clean and dry, repositioned in supine position, reversed from anesthesia, and taken to PACU in stable condition.  She remained hemodynamically stable throughout the procedure.  Plan: She will be admitted for IV antibiotics and observation.  If she remains afebrile, she will be discharged within 24 hours.  Vanna ScotlandAshley Jarrett Albor, M.D.

## 2017-04-29 NOTE — H&P (Signed)
Urology Consult  I have been asked to see the patient by Dr. Derrill Kay, for evaluation and management of right UVJ stone.  Chief Complaint: right flank pain  History of Present Illness: Toni Salazar is a 32 y.o. year old presenting to the emergency room overnight with severe onset sudden right flank pain which was 10/10 along with chills, nausea and vomiting.  She also had a sensation of needing to void but was unable to do so.  No dysuria.  She is currently menstruating so is difficult to assess whether or not she is had gross hematuria.  As part of the workup, noncontrast CT scan was performed which showed a 4 mm right UVJ stone, a small nonobstructing stone on the same side, hydronephrosis with nephromegaly and perinephric stranding.  She also has a leukocytosis to 20 as well as a suspicious appearing urine with too numerous to count red blood cells and red white blood cells.  She is otherwise afebrile and hemodynamically stable.  No personal history of kidney stones.  History reviewed. No pertinent past medical history.  Past Surgical History:  Procedure Laterality Date  . CESAREAN SECTION      Home Medications:  No outpatient medications have been marked as taking for the 04/28/17 encounter [facility] Encounter).    Allergies:  Allergies  Allergen Reactions  . Amoxicillin Hives and Swelling    Has patient had a PCN reaction causing immediate rash, facial/tongue/throat swelling, SOB or lightheadedness with hypotension: No Has patient had a PCN reaction causing severe rash involving mucus membranes or skin necrosis: No Has patient had a PCN reaction that required hospitalization: No Has patient had a PCN reaction occurring within the last 10 years: Yes If all of the above answers are "NO", then may proceed with Cephalosporin use.   . Sulfa Antibiotics Hives    History reviewed. No pertinent family history.  Social History:  reports that she has been smoking.  she has  never used smokeless tobacco. She reports that she drinks alcohol. She reports that she does not use drugs.  ROS: A complete review of systems was performed.  All systems are negative except for pertinent findings as noted.  Physical Exam:  Vital signs in last 24 hours: Temp:  [98 F (36.7 C)] 98 F (36.7 C) (02/13 2204) Pulse Rate:  [88-90] 90 (02/14 0107) Resp:  [22] 22 (02/13 2204) BP: (117-133)/(84-89) 133/84 (02/14 0107) SpO2:  [98 %-99 %] 99 % (02/14 0107) Weight:  [190 lb (86.2 kg)] 190 lb (86.2 kg) (02/13 2203) Constitutional:  Alert and oriented, No acute distress HEENT: [place] AT, moist mucus membranes.  Trachea midline, no masses Cardiovascular: Regular rate and rhythm, no clubbing, cyanosis, or edema. Respiratory: Normal respiratory effort, lungs clear bilaterally GI: Abdomen is soft, nontender, nondistended, no abdominal masses GU: No CVA tenderness Skin: No rashes, bruises or suspicious lesions Neurologic: Grossly intact, no focal deficits, moving all 4 extremities Psychiatric: Normal mood and affect   Laboratory Data:  Recent Labs    04/28/17 2229  WBC 20.6*  HGB 14.2  HCT 42.6   Recent Labs    04/28/17 2229  NA 139  K 3.9  CL 105  CO2 24  GLUCOSE 126*  BUN 17  CREATININE 0.85  CALCIUM 9.2    Radiologic Imaging: Ct Renal Stone Study  Result Date: 04/29/2017 CLINICAL DATA:  RIGHT flank pain and abdominal pain beginning tonight. Currently menstruating, urinary urgency. EXAM: CT ABDOMEN AND PELVIS WITHOUT CONTRAST TECHNIQUE: Multidetector  CT imaging of the abdomen and pelvis was performed following the standard protocol without IV contrast. COMPARISON:  None. FINDINGS: LOWER CHEST: Lung bases are clear. The visualized heart size is normal. No pericardial effusion. HEPATOBILIARY: Normal. PANCREAS: Normal. SPLEEN: Normal. ADRENALS/URINARY TRACT: Kidneys are orthotopic, demonstrating normal size and morphology. Moderate RIGHT hydroureteronephrosis to the level  of the ureterovesicular junction where a 4 mm calculus is present. Residual 3 mm RIGHT upper pole nephrolithiasis. Mild RIGHT nephromegaly and pararenal fat stranding. No LEFT hydronephrosis or nephrolithiasis. Limited assessment for renal masses on this nonenhanced examination. Urinary bladder is decompressed. Normal adrenal glands. STOMACH/BOWEL: The stomach, small and large bowel are normal in course and caliber without inflammatory changes, sensitivity decreased by lack of enteric contrast. A few scattered colonic diverticula noted. Normal appendix. VASCULAR/LYMPHATIC: Aortoiliac vessels are normal in course and caliber. No lymphadenopathy by CT size criteria. REPRODUCTIVE: Normal. OTHER: No intraperitoneal free fluid or free air. MUSCULOSKELETAL: Non-acute. IMPRESSION: 1. 4 mm RIGHT ureterovesicular junction calculus resulting in moderate obstructive uropathy. 3 mm residual RIGHT upper pole nephrolithiasis. 2. RIGHT nephromegaly and inflammatory changes concerning for superimposed urinary tract infection/pyelonephritis. Electronically Signed   By: Awilda Metroourtnay  Bloomer M.D.   On: 04/29/2017 00:03   CT scan personally reviewed today.  Impression/Assessment:  32 year old female with an obstructing 4 mm right UVJ stone, leukocytosis, and suspicious urine all concerning for infection.  Additionally, her CT scan shows fairly significant nephromegaly which is concerning for an underlying pyelonephritis.  In the setting of infection and obstructing stone, probably recommended emergent right ureteral stent placement.  Risk and benefits were reviewed in detail including the risk of bleeding, infection, stent colic, need for further procedures, worsening of sepsis amongst others.  All questions answered.  Plan:  -Consent obtained for cystoscopy, right ureteral stent placement -Plan for admission postoperatively for IV antibiotics and supportive care, currently receiving Cipro and will broaden antibiotics to  include gentamicin -All questions answered  04/29/2017, 1:36 AM  Vanna ScotlandAshley Kenetha Cozza,  MD

## 2017-04-29 NOTE — Anesthesia Procedure Notes (Signed)
Procedure Name: Intubation Date/Time: 04/29/2017 2:03 AM Performed by: Waldo LaineJustis, Algis Lehenbauer, CRNA Pre-anesthesia Checklist: Patient identified, Patient being monitored, Timeout performed, Emergency Drugs available and Suction available Patient Re-evaluated:Patient Re-evaluated prior to induction Oxygen Delivery Method: Circle system utilized Preoxygenation: Pre-oxygenation with 100% oxygen Induction Type: IV induction, Rapid sequence and Cricoid Pressure applied Laryngoscope Size: Miller and 2 Grade View: Grade II Tube type: Oral Tube size: 7.0 mm Number of attempts: 1 Airway Equipment and Method: Stylet Placement Confirmation: ETT inserted through vocal cords under direct vision,  positive ETCO2 and breath sounds checked- equal and bilateral Secured at: 20 cm Tube secured with: Tape Dental Injury: Teeth and Oropharynx as per pre-operative assessment

## 2017-04-29 NOTE — Anesthesia Postprocedure Evaluation (Signed)
Anesthesia Post Note  Patient: Toni Salazar  Procedure(s) Performed: CYSTOSCOPY WITH STENT PLACEMENT (Right Ureter)  Patient location during evaluation: PACU Anesthesia Type: General Level of consciousness: awake and alert and oriented Pain management: pain level controlled Vital Signs Assessment: post-procedure vital signs reviewed and stable Respiratory status: spontaneous breathing Cardiovascular status: blood pressure returned to baseline Anesthetic complications: no     Last Vitals:  Vitals:   04/29/17 0314 04/29/17 0329  BP: 122/85 120/77  Pulse: 78 76  Resp: 20 16  Temp: (!) 36.3 C 36.7 C  SpO2: 100% 100%    Last Pain:  Vitals:   04/29/17 0329  TempSrc: Oral  PainSc:                  [name]

## 2017-04-29 NOTE — ED Provider Notes (Signed)
CT scan does show a right sided ureteral stone.  Additionally concerns for possible infection.  Given that finding and white blood cell count of 23 did contact Dr. Apolinar JunesBrandon with urology.  She will plan on lithotripsy and stent placement.  I discussed this with the patient.  Patient states that if she is given amoxicillin she starts have breathing difficulties.  Thus I have written for Cipro.   Phineas SemenGoodman, Calvert Charland, MD 04/29/17 [phone]

## 2017-04-29 NOTE — Anesthesia Preprocedure Evaluation (Signed)
Anesthesia Evaluation  Patient identified by MRN, date of birth, ID band Patient awake    Reviewed: Allergy & Precautions, NPO status , Patient's Chart, lab work & pertinent test results  Airway Mallampati: II  TM Distance: >3 FB     Dental  (+) Teeth Intact   Pulmonary Current Smoker,    Pulmonary exam normal        Cardiovascular negative cardio ROS Normal cardiovascular exam     Neuro/Psych negative neurological ROS  negative psych ROS   GI/Hepatic negative GI ROS, Neg liver ROS,   Endo/Other  negative endocrine ROS  Renal/GU   negative genitourinary   Musculoskeletal negative musculoskeletal ROS (+)   Abdominal Normal abdominal exam  (+)   Peds negative pediatric ROS (+)  Hematology negative hematology ROS (+)   Anesthesia Other Findings   Reproductive/Obstetrics                             Anesthesia Physical Anesthesia Plan  ASA: II and emergent  Anesthesia Plan: General   Post-op Pain Management:    Induction: Rapid sequence, Cricoid pressure planned and Intravenous  PONV Risk Score and Plan:   Airway Management Planned: Oral ETT  Additional Equipment:   Intra-op Plan:   Post-operative Plan: Extubation in OR  Informed Consent: I have reviewed the patients History and Physical, chart, labs and discussed the procedure including the risks, benefits and alternatives for the proposed anesthesia with the patient or authorized representative who has indicated his/her understanding and acceptance.   Dental advisory given  Plan Discussed with: CRNA and Surgeon  Anesthesia Plan Comments:         Anesthesia Quick Evaluation

## 2017-04-29 NOTE — ED Notes (Signed)
Pt clothing and jewelry removed and placed into a patient belonging bag with pt sticker placed on bag. Pt currently in gown only as request by surgical team. Surgical team member states the consent will be completed in their department due to the fact the doctor will not be seeing pt until pt is on their unit.

## 2017-04-29 NOTE — H&P (View-Only) (Signed)
04/29/17  Patient seen and examined this afternoon.  She remains afebrile and not tachycardic.  She is complaining of stent discomfort with flank pain with voiding and bladder irritation.  Discussed discharge planning today.  Like her to stay for 1 additional dose of IV antibiotics which is due at around 2 AM.  She would like to be discharged almost immediately following these antibiotics due to social issues and childcare reasons.  If she remains afebrile, feel that this is reasonable and will prepare her discharge paperwork.  This was also discussed with her nurse.  We also discussed definitive management of her stone.  She will be prescribed oral antibiotics and adjusted as needed.  She is interested in returning to the operating room next Friday for outpatient ureteroscopy to ensure that the stone is passed and possible stent removal.  Risks and benefits of ureteroscopy were reviewed including but not limited to infection, bleeding, pain, ureteral injury which could require open surgery versus prolonged indwelling if ureteralperforation occurs, persistent stone disease, requirement for staged procedure, possible stent, and global anesthesia risks. Patient expressed understanding and desires to proceed with ureteroscopy.  Outpatient surgery to be arranged.  Anticipate discharge in early a.m. Hours.  Javyon Fontan, MD  

## 2017-04-29 NOTE — Transfer of Care (Signed)
Immediate Anesthesia Transfer of Care Note  Patient: Toni BayleyBrandy N Gordy  Procedure(s) Performed: CYSTOSCOPY WITH STENT PLACEMENT (Right Ureter)  Patient Location: PACU  Anesthesia Type:General  Level of Consciousness: awake, alert , oriented and patient cooperative  Airway & Oxygen Therapy: Patient Spontanous Breathing  Post-op Assessment: Report given to RN and Post -op Vital signs reviewed and stable  Post vital signs: Reviewed and stable  Last Vitals:  Vitals:   04/29/17 0107 04/29/17 0229  BP: 133/84 98/76  Pulse: 90 (!) 106  Resp:  18  Temp:  (!) 36.4 C  SpO2: 99% 91%    Last Pain:  Vitals:   04/29/17 0229  TempSrc:   PainSc: 0-No pain         Complications: No apparent anesthesia complications

## 2017-04-29 NOTE — ED Notes (Signed)
Pt taken to surgical unit by beverly

## 2017-04-29 NOTE — Progress Notes (Signed)
04/29/17  Patient seen and examined this afternoon.  She remains afebrile and not tachycardic.  She is complaining of stent discomfort with flank pain with voiding and bladder irritation.  Discussed discharge planning today.  Like her to stay for 1 additional dose of IV antibiotics which is due at around 2 AM.  She would like to be discharged almost immediately following these antibiotics due to social issues and childcare reasons.  If she remains afebrile, feel that this is reasonable and will prepare her discharge paperwork.  This was also discussed with her nurse.  We also discussed definitive management of her stone.  She will be prescribed oral antibiotics and adjusted as needed.  She is interested in returning to the operating room next Friday for outpatient ureteroscopy to ensure that the stone is passed and possible stent removal.  Risks and benefits of ureteroscopy were reviewed including but not limited to infection, bleeding, pain, ureteral injury which could require open surgery versus prolonged indwelling if ureteralperforation occurs, persistent stone disease, requirement for staged procedure, possible stent, and global anesthesia risks. Patient expressed understanding and desires to proceed with ureteroscopy.  Outpatient surgery to be arranged.  Anticipate discharge in early a.m. Hours.  Vanna ScotlandAshley Tonnie Stillman, MD

## 2017-04-29 NOTE — Discharge Summary (Signed)
Date of admission: 04/29/17  Date of discharge: 04/30/17  Admission diagnosis: Right ureteral stone, UTI  Discharge diagnosis: same as above  Secondary diagnoses:  Patient Active Problem List   Diagnosis Date Noted  . Right ureteral stone 04/29/2017    History and Physical: For full details, please see admission history and physical. Briefly, Toni Salazar is a 32 y.o. year old patient with obstructing right ureteral stone .   Hospital Course: Patient tolerated the procedure well.  She was then transferred to the floor after an uneventful PACU stay.  Her hospital course was uncomplicated.  On POD#1 she had met discharge criteria: was eating a regular diet, was up and ambulating independently,  pain was well controlled, was voiding without a catheter, and was ready to for discharge.   Laboratory values:  Recent Labs    04/28/17 2229  WBC 20.6*  HGB 14.2  HCT 42.6   Recent Labs    04/28/17 2229  NA 139  K 3.9  CL 105  CO2 24  GLUCOSE 126*  BUN 17  CREATININE 0.85  CALCIUM 9.2   No results for input(s): LABPT, INR in the last 72 hours. No results for input(s): LABURIN in the last 72 hours. No results found for this or any previous visit.  Disposition: Home  Discharge instruction: .You have a ureteral stent in place.  This is a tube that extends from your kidney to your bladder.  This may cause urinary bleeding, burning with urination, and urinary frequency.  Please call our office or present to the ED if you develop fevers >101 or pain which is not able to be controlled with oral pain medications.  You may be given either Flomax and/ or ditropan to help with bladder spasms and stent pain in addition to pain medications.    [facility] [address] [place], [place] [address] [phone]    Discharge medications:  Allergies as of 04/29/2017      Reactions   Amoxicillin Hives, Swelling   Has patient had a PCN reaction causing  immediate rash, facial/tongue/throat swelling, SOB or lightheadedness with hypotension: No Has patient had a PCN reaction causing severe rash involving mucus membranes or skin necrosis: No Has patient had a PCN reaction that required hospitalization: No Has patient had a PCN reaction occurring within the last 10 years: Yes If all of the above answers are "NO", then may proceed with Cephalosporin use.   Sulfa Antibiotics Hives      Medication List    TAKE these medications   ciprofloxacin 500 MG tablet Commonly known as:  CIPRO Take 1 tablet (500 mg total) by mouth 2 (two) times daily.   docusate sodium 100 MG capsule Commonly known as:  COLACE Take 1 capsule (100 mg total) by mouth 2 (two) times daily.   HYDROcodone-acetaminophen 5-325 MG tablet Commonly known as:  NORCO/VICODIN Take 1-2 tablets by mouth every 6 (six) hours as needed for moderate pain.   oxybutynin 5 MG tablet Commonly known as:  DITROPAN Take 1 tablet (5 mg total) by mouth every 8 (eight) hours as needed for bladder spasms.   tamsulosin 0.4 MG Caps capsule Commonly known as:  FLOMAX Take 1 capsule (0.4 mg total) by mouth daily.       Followup:  Follow-up Information    Toni Espy, MD On 05/07/2017.   Specialty:  Urology Why:  for surgery (outpatient ureteroscopy). The office said Toni Salazar will call you for your follow up.  Contact information: [place] [phone]

## 2017-04-29 NOTE — Progress Notes (Addendum)
Pharmacy Antibiotic Note  Toni Salazar is a 32 y.o. female admitted on 04/28/2017 with surgical prophylaxis.  Pharmacy has been consulted for gentamicin dosing.  Plan: Will dose patient 5 mg/kg of gentamicin per adjust body weight  (64.5 kg) Gentamicin dose = 320 mg IV x 1 for surgical prophylaxis   Height: 5\' 2"  (157.5 cm) Weight: 190 lb (86.2 kg) IBW/kg (Calculated) : 50.1  Temp (24hrs), Avg:98 F (36.7 C), Min:98 F (36.7 C), Max:98 F (36.7 C)  Recent Labs  Lab 04/28/17 2229  WBC 20.6*  CREATININE 0.85    Estimated Creatinine Clearance: 98.5 mL/min (by C-G formula based on SCr of 0.85 mg/dL).    Allergies  Allergen Reactions  . Amoxicillin Hives and Swelling    Has patient had a PCN reaction causing immediate rash, facial/tongue/throat swelling, SOB or lightheadedness with hypotension: No Has patient had a PCN reaction causing severe rash involving mucus membranes or skin necrosis: No Has patient had a PCN reaction that required hospitalization: No Has patient had a PCN reaction occurring within the last 10 years: Yes If all of the above answers are "NO", then may proceed with Cephalosporin use.   . Sulfa Antibiotics Hives    Thank you for allowing pharmacy to be a part of this patient's care.  Thomasene Rippleavid Besanti, PharmD, BCPS Clinical Pharmacist 04/29/2017   Addendum:  MD gave verbal order to RN for pharmacy to give one more dose Gentamicin tomorrow 0230 before patient is discharged.  Ardyth HarpsKaren Ardell Aaronson, [facility] Hospital[facility]PH 04/29/2017  1340

## 2017-04-29 NOTE — Discharge Instructions (Signed)
You have a ureteral stent in place.  This is a tube that extends from your kidney to your bladder.  This may cause urinary bleeding, burning with urination, and urinary frequency.  Please call our office or present to the ED if you develop fevers >101 or pain which is not able to be controlled with oral pain medications.  You may be given either Flomax and/ or ditropan to help with bladder spasms and stent pain in addition to pain medications.    [facility] [address], [address] [place], [place] [address] [phone]   Ureteroscopy Ureteroscopy is a procedure to check for and treat problems inside part of the urinary tract. In this procedure, a thin, tube-shaped instrument with a light at the end (ureteroscope) is used to look at the inside of the kidneys and the ureters, which are the tubes that carry urine from the kidneys to the bladder. The ureteroscope is inserted into one or both of the ureters. You may need this procedure if you have frequent urinary tract infections (UTIs), blood in your urine, or a stone in one of your ureters. A ureteroscopy can be done to find the cause of urine blockage in a ureter and to evaluate other abnormalities inside the ureters or kidneys. If stones are found, they can be removed during the procedure. Polyps, abnormal tissue, and some types of tumors can also be removed or treated. The ureteroscope may also have a tool to remove tissue to be checked for disease under a microscope (biopsy). Tell a health care provider about:  Any allergies you have.  All medicines you are taking, including vitamins, herbs, eye drops, creams, and over-the-counter medicines.  Any problems you or family members have had with anesthetic medicines.  Any blood disorders you have.  Any surgeries you have had.  Any medical conditions you have.  Whether you are pregnant or may be pregnant. What are the risks? Generally, this is a safe procedure.  However, problems may occur, including:  Bleeding.  Infection.  Allergic reactions to medicines.  Scarring that narrows the ureter (stricture).  Creating a hole in the ureter (perforation).  What happens before the procedure? Staying hydrated Follow instructions from your health care provider about hydration, which may include:  Up to 2 hours before the procedure - you may continue to drink clear liquids, such as water, clear fruit juice, black coffee, and plain tea.  Eating and drinking restrictions Follow instructions from your health care provider about eating and drinking, which may include:  8 hours before the procedure - stop eating heavy meals or foods such as meat, fried foods, or fatty foods.  6 hours before the procedure - stop eating light meals or foods, such as toast or cereal.  6 hours before the procedure - stop drinking milk or drinks that contain milk.  2 hours before the procedure - stop drinking clear liquids.  Medicines  Ask your health care provider about: ? Changing or stopping your regular medicines. This is especially important if you are taking diabetes medicines or blood thinners. ? Taking medicines such as aspirin and ibuprofen. These medicines can thin your blood. Do not take these medicines before your procedure if your health care provider instructs you not to.  You may be given antibiotic medicine to help prevent infection. General instructions  You may have a urine sample taken to check for infection.  Plan to have someone take you home from the hospital or clinic. What happens  during the procedure?  To reduce your risk of infection: ? Your health care team will wash or sanitize their hands. ? Your skin will be washed with soap.  An IV tube will be inserted into one of your veins.  You will be given one of the following: ? A medicine to help you relax (sedative). ? A medicine to make you fall asleep (general anesthetic). ? A medicine  that is injected into your spine to numb the area below and slightly above the injection site (spinal anesthetic).  To lower your risk of infection, you may be given an antibiotic medicine by an injection or through the IV tube.  The opening from which you urinate (urethra) will be cleaned with a germ-killing solution.  The ureteroscope will be passed through your urethra into your bladder.  A salt-water solution will flow through the ureteroscope to fill your bladder. This will help the health care provider see the openings of your ureters more clearly.  Then, the ureteroscope will be passed into your ureter. ? If a growth is found, a piece of it may be removed so it can be examined under a microscope (biopsy). ? If a stone is found, it may be removed through the ureteroscope, or the stone may be broken up using a laser, shock waves, or electrical energy. ? In some cases, if the ureter is too small, a tube may be inserted that keeps the ureter open (ureteral stent). The stent may be left in place for 1 or 2 weeks to keep the ureter open, and then the ureteroscopy procedure will be performed.  The scope will be removed, and your bladder will be emptied. The procedure may vary among health care providers and hospitals. What happens after the procedure?  Your blood pressure, heart rate, breathing rate, and blood oxygen level will be monitored until the medicines you were given have worn off.  You may be asked to urinate.  Donot drive for 24 hours if you were given a sedative. This information is not intended to replace advice given to you by your health care provider. Make sure you discuss any questions you have with your health care provider. Document Released: 03/07/2013 Document Revised: 12/17/2015 Document Reviewed: 12/13/2015 Elsevier Interactive Patient Education  [employer].

## 2017-04-29 NOTE — Anesthesia Post-op Follow-up Note (Signed)
Anesthesia QCDR form completed.        

## 2017-04-30 ENCOUNTER — Encounter: Payer: Self-pay | Admitting: Urology

## 2017-04-30 ENCOUNTER — Other Ambulatory Visit: Payer: Self-pay | Admitting: Radiology

## 2017-04-30 LAB — URINE CULTURE: Special Requests: NORMAL

## 2017-04-30 LAB — HIV ANTIBODY (ROUTINE TESTING W REFLEX): HIV Screen 4th Generation wRfx: NONREACTIVE

## 2017-04-30 NOTE — Progress Notes (Signed)
Pt finished IV antibiotics. Pt ready to be discharged. IV removed. Dressing intact. Discharge instructions and prescriptions given to pt with all questions answered. Pt denied wheelchair. Security was notified of pt discharge and pt was escorted out to the Emergency Room parking lot.

## 2017-05-03 ENCOUNTER — Telehealth: Payer: Self-pay | Admitting: Radiology

## 2017-05-03 ENCOUNTER — Ambulatory Visit (INDEPENDENT_AMBULATORY_CARE_PROVIDER_SITE_OTHER): Payer: Medicaid Other

## 2017-05-03 DIAGNOSIS — N200 Calculus of kidney: Secondary | ICD-10-CM | POA: Diagnosis not present

## 2017-05-03 LAB — URINALYSIS, COMPLETE
Bilirubin, UA: NEGATIVE
Glucose, UA: NEGATIVE
Ketones, UA: NEGATIVE
NITRITE UA: NEGATIVE
Specific Gravity, UA: 1.015 (ref 1.005–1.030)
Urobilinogen, Ur: 0.2 mg/dL (ref 0.2–1.0)
pH, UA: 7.5 (ref 5.0–7.5)

## 2017-05-03 LAB — MICROSCOPIC EXAMINATION

## 2017-05-03 NOTE — Telephone Encounter (Signed)
Appt made.  Pt aware.

## 2017-05-03 NOTE — Progress Notes (Signed)
In and Out Catheterization  Patient is present today for a I & O catheterization due to culture needed for recollection. Patient was cleaned and prepped in a sterile fashion with betadine and Lidocaine 2% jelly was instilled into the urethra.  A 14FR cath was inserted no complications were noted , 40ml of urine return was noted, urine was pink cloudy  in color. A clean urine sample was collected for UA and culture. Bladder was drained  And catheter was removed with out difficulty.    Preformed by: Toni Salazar, CMA  Follow up/ Additional notes: will call with results

## 2017-05-03 NOTE — Telephone Encounter (Signed)
-----   Message from Vanna ScotlandAshley Brandon, MD sent at 04/30/2017  4:31 PM EST ----- Could you please see if she would come in on Monday for catheted UA/culture?  Her urine from the hospital was likely contaminated and I would like to ideally have it prior to Friday.    Vanna ScotlandAshley Brandon, MD

## 2017-05-05 ENCOUNTER — Encounter
Admission: RE | Admit: 2017-05-05 | Discharge: 2017-05-05 | Disposition: A | Discharge status: Home / Self Care | Payer: Medicaid Other | Source: Ambulatory Visit | Attending: Urology | Admitting: Urology

## 2017-05-05 ENCOUNTER — Other Ambulatory Visit: Payer: Self-pay

## 2017-05-05 HISTORY — DX: Personal history of urinary calculi: Z87.442

## 2017-05-05 NOTE — Patient Instructions (Signed)
Your procedure is scheduled on: 05-07-17 FRIDAY Report to [facility] 2nd floor medical mall [facility](Medical Mall Entrance-take elevator on left to 2nd floor.  Check in with surgery information desk.) To find out your arrival time please call [phone] between 1PM - 3PM on 05-06-17 THURSDAY  Remember: Instructions that are not followed completely may result in serious medical risk, up to and including death, or upon the discretion of your surgeon and anesthesiologist your surgery may need to be rescheduled.    _x___ 1. Do not eat food after midnight the night before your procedure. NO GUM OR CANDY AFTER MIDNIGHT. You may drink clear liquids up to 2 hours before you are scheduled to arrive at the hospital for your procedure.  Do not drink clear liquids within 2 hours of your scheduled arrival to the hospital.  Clear liquids include  --Water or Apple juice without pulp  --Clear carbohydrate beverage such as ClearFast or Gatorade  --Black Coffee or Clear Tea (No milk, no creamers, do not add anything to the coffee or Tea     __x__ 2. No Alcohol for 24 hours before or after surgery.   __x__3. No Smoking or e-cigarettes for 24 prior to surgery.  Do not use any chewable tobacco products for at least 6 hour prior to surgery   ____  4. Bring all medications with you on the day of surgery if instructed.    __x__ 5. Notify your doctor if there is any change in your medical condition     (cold, fever, infections).    x___6. On the morning of surgery brush your teeth with toothpaste and water.  You may rinse your mouth with mouth wash if you wish.  Do not swallow any toothpaste or mouthwash.   Do not wear jewelry, make-up, hairpins, clips or nail polish.  Do not wear lotions, powders, or perfumes. You may wear deodorant.  Do not shave 48 hours prior to surgery. Men may shave face and neck.  Do not bring valuables to the hospital.    [facility] is not responsible for any belongings or  valuables.               Contacts, dentures or bridgework may not be worn into surgery.  Leave your suitcase in the car. After surgery it may be brought to your room.  For patients admitted to the hospital, discharge time is determined by your  treatment team.  _  Patients discharged the day of surgery will not be allowed to drive home.  You will need someone to drive you home and stay with you the night of your procedure.    Please read over the following fact sheets that you were given:   [facility] Preparing [facility] and or MRSA Information   _x___ TAKE THE FOLLOWING MEDICATION THE MORNING OF SURGERY WITH A SMALL SIP OF WATER. These include:  1. FLOMAX  2. YOU MAY TAKE A HYDROCODONE AM OF SURGERY IF NEEDED WITH A SMALL SIP OF WATER  3.  4.  5.  6.  ____Fleets enema or Magnesium Citrate as directed.   ____ Use CHG Soap or sage wipes as directed on instruction sheet   ____ Use inhalers on the day of surgery and bring to hospital day of surgery  ____ Stop Metformin and Janumet 2 days prior to surgery.    ____ Take 1/2 of usual insulin dose the night before surgery and none on the morning surgery.   ____ Follow  recommendations from Cardiologist, Pulmonologist or PCP regarding stopping Aspirin, Coumadin, Plavix ,Eliquis, Effient, or Pradaxa, and Pletal.  X____Stop Anti-inflammatories such as Advil PM, Aleve, Ibuprofen, Motrin, Naproxen, Naprosyn, Goodies powders or aspirin products NOW-OK to take Tylenol    ____ Stop supplements until after surgery.     ____ Bring C-Pap to the hospital.

## 2017-05-05 NOTE — Progress Notes (Addendum)
ANTIBIOTIC CONSULT NOTE - INITIAL  Pharmacy Consult for gentamicin Indication: surgical prophylaxis  Allergies  Allergen Reactions  . Amoxicillin Hives and Swelling    Has patient had a PCN reaction causing immediate rash, facial/tongue/throat swelling, SOB or lightheadedness with hypotension: No Has patient had a PCN reaction causing severe rash involving mucus membranes or skin necrosis: No Has patient had a PCN reaction that required hospitalization: No Has patient had a PCN reaction occurring within the last 10 years: Yes If all of the above answers are "NO", then may proceed with Cephalosporin use.   . Sulfa Antibiotics Hives    Patient Measurements:   Adjusted Body Weight:   Vital Signs:   Intake/Output from previous day: No intake/output data recorded. Intake/Output from this shift: No intake/output data recorded.  Labs: No results for input(s): WBC, HGB, PLT, LABCREA, CREATININE in the last 72 hours. Estimated Creatinine Clearance: 98.5 mL/min (by C-G formula based on SCr of 0.85 mg/dL). No results for input(s): VANCOTROUGH, VANCOPEAK, VANCORANDOM, GENTTROUGH, GENTPEAK, GENTRANDOM, TOBRATROUGH, TOBRAPEAK, TOBRARND, AMIKACINPEAK, AMIKACINTROU, AMIKACIN in the last 72 hours.   Microbiology: Recent Results (from the past 720 hour(s))  Urine Culture     Status: Abnormal   Collection Time: 04/28/17 11:45 PM  Result Value Ref Range Status   Specimen Description   Final    URINE, CLEAN CATCH Performed at [facility], [address] [place], [place] [address]    Special Requests   Final    Normal Performed at [facility], [address] [place], [place] [address]    Culture MULTIPLE SPECIES PRESENT, SUGGEST RECOLLECTION (A)  Final   Report Status 04/30/2017 FINAL  Final  Microscopic Examination     Status: Abnormal   Collection Time: 05/03/17  3:05 PM  Result Value Ref Range Status   WBC, UA 0-5 0 - 5 /hpf Final   RBC, UA >30 (H) 0 - 2 /hpf  Final   Epithelial Cells (non renal) 0-10 0 - 10 /hpf Final   Mucus, UA Present (A) Not Estab. Final   Bacteria, UA Few (A) None seen/Few Final  CULTURE, URINE COMPREHENSIVE     Status: None (Preliminary result)   Collection Time: 05/03/17  3:33 PM  Result Value Ref Range Status   Urine Culture, Comprehensive Preliminary report  Preliminary   Organism ID, Bacteria Comment  Preliminary    Comment: No growth after 18-24 hours.    Medical History: Past Medical History:  Diagnosis Date  . History of kidney stones     Medications:  Infusions:  Assessment: 31 yof for planned ureteroscopy, lithotripsy, and stent exchange. Pharmacy consulted to dose gentamicin for surgical prophylaxis.  Goal of Therapy:  Prevent infection Prevent ADE  Plan:  Gentamicin 5 mg/kg IV once pre-op. Procedure is planned for Friday 05/07/17 at 07:45 but is subject to change.   Carola FrostNathan A Herrick Hartog, Pharm.D., BCPS Clinical Pharmacist 05/05/2017,4:11 PM

## 2017-05-06 LAB — CULTURE, URINE COMPREHENSIVE

## 2017-05-07 ENCOUNTER — Ambulatory Visit: Payer: Medicaid Other | Admitting: Certified Registered"

## 2017-05-07 ENCOUNTER — Encounter
Admission: RE | Disposition: A | Discharge status: Home / Self Care | Payer: Self-pay | Source: Ambulatory Visit | Attending: Urology

## 2017-05-07 ENCOUNTER — Encounter: Payer: Self-pay | Admitting: *Deleted

## 2017-05-07 ENCOUNTER — Ambulatory Visit
Admission: RE | Admit: 2017-05-07 | Discharge: 2017-05-07 | Disposition: A | Discharge status: Home / Self Care | Payer: Medicaid Other | Source: Ambulatory Visit | Attending: Urology | Admitting: Urology

## 2017-05-07 DIAGNOSIS — Z711 Person with feared health complaint in whom no diagnosis is made: Secondary | ICD-10-CM | POA: Diagnosis not present

## 2017-05-07 DIAGNOSIS — F17200 Nicotine dependence, unspecified, uncomplicated: Secondary | ICD-10-CM | POA: Insufficient documentation

## 2017-05-07 DIAGNOSIS — N201 Calculus of ureter: Secondary | ICD-10-CM | POA: Diagnosis not present

## 2017-05-07 HISTORY — PX: CYSTOSCOPY WITH URETEROSCOPY: SHX5123

## 2017-05-07 HISTORY — PX: STENT REMOVAL: SHX6421

## 2017-05-07 LAB — POCT PREGNANCY, URINE: PREG TEST UR: NEGATIVE

## 2017-05-07 SURGERY — CYSTOSCOPY WITH URETEROSCOPY
Anesthesia: General | Laterality: Right | Wound class: Clean Contaminated

## 2017-05-07 MED ORDER — GLYCOPYRROLATE 0.2 MG/ML IJ SOLN
INTRAMUSCULAR | Status: DC | PRN
  Administered 2017-05-07: 0.1 mg via INTRAVENOUS

## 2017-05-07 MED ORDER — ACETAMINOPHEN 10 MG/ML IV SOLN
INTRAVENOUS | Status: AC
  Filled 2017-05-07: qty 100

## 2017-05-07 MED ORDER — LIDOCAINE HCL (CARDIAC) 20 MG/ML IV SOLN
INTRAVENOUS | Status: DC | PRN
  Administered 2017-05-07: 60 mg via INTRAVENOUS

## 2017-05-07 MED ORDER — HYDROCODONE-ACETAMINOPHEN 5-325 MG PO TABS: 1.0000 | ORAL_TABLET | Freq: Four times a day (QID) | ORAL | 0 refills | Status: DC | PRN

## 2017-05-07 MED ORDER — KETOROLAC TROMETHAMINE 30 MG/ML IJ SOLN
INTRAMUSCULAR | Status: DC | PRN
  Administered 2017-05-07: 30 mg via INTRAVENOUS

## 2017-05-07 MED ORDER — PROPOFOL 10 MG/ML IV BOLUS
INTRAVENOUS | Status: AC
  Filled 2017-05-07: qty 40

## 2017-05-07 MED ORDER — FAMOTIDINE 20 MG PO TABS
20.0000 mg | ORAL_TABLET | Freq: Once | ORAL | Status: AC
  Administered 2017-05-07: 20 mg via ORAL

## 2017-05-07 MED ORDER — GENTAMICIN SULFATE 40 MG/ML IJ SOLN
5.0000 mg/kg | INTRAVENOUS | Status: AC
  Administered 2017-05-07: 430 mg via INTRAVENOUS
  Filled 2017-05-07: qty 10.75

## 2017-05-07 MED ORDER — ONDANSETRON HCL 4 MG/2ML IJ SOLN
INTRAMUSCULAR | Status: DC | PRN
  Administered 2017-05-07: 4 mg via INTRAVENOUS

## 2017-05-07 MED ORDER — HYDROMORPHONE HCL 1 MG/ML IJ SOLN
INTRAMUSCULAR | Status: DC | PRN
  Administered 2017-05-07 (×2): 1 mg via INTRAVENOUS

## 2017-05-07 MED ORDER — PROPOFOL 10 MG/ML IV BOLUS
INTRAVENOUS | Status: DC | PRN
  Administered 2017-05-07: 150 mg via INTRAVENOUS

## 2017-05-07 MED ORDER — ACETAMINOPHEN 10 MG/ML IV SOLN
INTRAVENOUS | Status: DC | PRN
  Administered 2017-05-07: 1000 mg via INTRAVENOUS

## 2017-05-07 MED ORDER — FAMOTIDINE 20 MG PO TABS
ORAL_TABLET | ORAL | Status: AC
  Administered 2017-05-07: 20 mg via ORAL
  Filled 2017-05-07: qty 1

## 2017-05-07 MED ORDER — HYDROMORPHONE HCL 1 MG/ML IJ SOLN
INTRAMUSCULAR | Status: AC
  Filled 2017-05-07: qty 2

## 2017-05-07 MED ORDER — HYDROCODONE-ACETAMINOPHEN 5-325 MG PO TABS
ORAL_TABLET | ORAL | Status: AC
  Administered 2017-05-07: 1 via ORAL
  Filled 2017-05-07: qty 1

## 2017-05-07 MED ORDER — FENTANYL CITRATE (PF) 100 MCG/2ML IJ SOLN: 25.0000 ug | INTRAMUSCULAR | Status: DC | PRN

## 2017-05-07 MED ORDER — ONDANSETRON HCL 4 MG/2ML IJ SOLN
INTRAMUSCULAR | Status: AC
  Administered 2017-05-07: 4 mg via INTRAVENOUS
  Filled 2017-05-07: qty 2

## 2017-05-07 MED ORDER — LACTATED RINGERS IV SOLN
INTRAVENOUS | Status: DC
  Administered 2017-05-07 (×2): via INTRAVENOUS

## 2017-05-07 MED ORDER — MIDAZOLAM HCL 2 MG/2ML IJ SOLN
INTRAMUSCULAR | Status: AC
  Filled 2017-05-07: qty 4

## 2017-05-07 MED ORDER — ONDANSETRON HCL 4 MG/2ML IJ SOLN
4.0000 mg | Freq: Once | INTRAMUSCULAR | Status: AC | PRN
  Administered 2017-05-07: 4 mg via INTRAVENOUS

## 2017-05-07 MED ORDER — DEXAMETHASONE SODIUM PHOSPHATE 10 MG/ML IJ SOLN
INTRAMUSCULAR | Status: DC | PRN
  Administered 2017-05-07: 4 mg via INTRAVENOUS
  Administered 2017-05-07: 10 mg via INTRAVENOUS

## 2017-05-07 MED ORDER — PHENYLEPHRINE HCL 10 MG/ML IJ SOLN
INTRAMUSCULAR | Status: DC | PRN
  Administered 2017-05-07 (×3): 100 ug via INTRAVENOUS
  Administered 2017-05-07: 200 ug via INTRAVENOUS

## 2017-05-07 MED ORDER — HYDROCODONE-ACETAMINOPHEN 5-325 MG PO TABS
1.0000 | ORAL_TABLET | Freq: Four times a day (QID) | ORAL | Status: DC | PRN
  Administered 2017-05-07: 1 via ORAL

## 2017-05-07 SURGICAL SUPPLY — 29 items
BAG DRAIN CYSTO-URO LG1000N (MISCELLANEOUS) ×4 IMPLANT
BASKET ZERO TIP 1.9FR (BASKET) IMPLANT
BRUSH SCRUB EZ 1% IODOPHOR (MISCELLANEOUS) ×4 IMPLANT
CATH URETL 5X70 OPEN END (CATHETERS) ×4 IMPLANT
CNTNR SPEC 2.5X3XGRAD LEK (MISCELLANEOUS)
CONRAY 43 FOR UROLOGY 50M (MISCELLANEOUS) ×4 IMPLANT
CONT SPEC 4OZ STER OR WHT (MISCELLANEOUS)
CONTAINER SPEC 2.5X3XGRAD LEK (MISCELLANEOUS) IMPLANT
DRAPE UTILITY 15X26 TOWEL STRL (DRAPES) ×4 IMPLANT
FIBER LASER LITHO 273 (Laser) IMPLANT
GLOVE BIO SURGEON STRL SZ 6.5 (GLOVE) ×3 IMPLANT
GLOVE BIO SURGEONS STRL SZ 6.5 (GLOVE) ×1
GOWN STRL REUS W/ TWL LRG LVL3 (GOWN DISPOSABLE) ×4 IMPLANT
GOWN STRL REUS W/TWL LRG LVL3 (GOWN DISPOSABLE) ×4
GUIDEWIRE GREEN .038 145CM (MISCELLANEOUS) IMPLANT
INFUSOR MANOMETER BAG 3000ML (MISCELLANEOUS) ×4 IMPLANT
INTRODUCER DILATOR DOUBLE (INTRODUCER) IMPLANT
KIT TURNOVER CYSTO (KITS) ×4 IMPLANT
PACK CYSTO AR (MISCELLANEOUS) ×4 IMPLANT
SENSORWIRE 0.038 NOT ANGLED (WIRE) ×4
SET CYSTO W/LG BORE CLAMP LF (SET/KITS/TRAYS/PACK) ×4 IMPLANT
SHEATH URETERAL 12FRX35CM (MISCELLANEOUS) IMPLANT
SOL .9 NS 3000ML IRR  AL (IV SOLUTION) ×2
SOL .9 NS 3000ML IRR UROMATIC (IV SOLUTION) ×2 IMPLANT
STENT URET 6FRX24 CONTOUR (STENTS) IMPLANT
STENT URET 6FRX26 CONTOUR (STENTS) IMPLANT
SURGILUBE 2OZ TUBE FLIPTOP (MISCELLANEOUS) ×4 IMPLANT
WATER STERILE IRR 1000ML POUR (IV SOLUTION) ×4 IMPLANT
WIRE SENSOR 0.038 NOT ANGLED (WIRE) ×2 IMPLANT

## 2017-05-07 NOTE — Anesthesia Postprocedure Evaluation (Signed)
Anesthesia Post Note  Patient: Toni Salazar  Procedure(s) Performed: CYSTOSCOPY WITH URETEROSCOPY (Right ) STENT REMOVAL  Patient location during evaluation: PACU Anesthesia Type: General Level of consciousness: awake and alert Pain management: pain level controlled Vital Signs Assessment: post-procedure vital signs reviewed and stable Respiratory status: spontaneous breathing and respiratory function stable Cardiovascular status: blood pressure returned to baseline and stable Anesthetic complications: no     Last Vitals:  Vitals:   05/07/17 0822 05/07/17 0828  BP:  110/71  Pulse: 90 82  Resp: 18 17  Temp:    SpO2: 99% 96%    Last Pain:  Vitals:   05/07/17 0822  TempSrc:   PainSc: 0-No pain                 Karalee Hauter K

## 2017-05-07 NOTE — Interval H&P Note (Signed)
History and Physical Interval Note:  05/07/2017 7:26 AM  Toni BayleyBrandy N Melkonian  has presented today for surgery, with the diagnosis of right ureteral stone  The various methods of treatment have been discussed with the patient and family. After consideration of risks, benefits and other options for treatment, the patient has consented to  Procedure(s): CYSTOSCOPY/URETEROSCOPY/HOLMIUM LASER/STENT PLACEMENT (Right) as a surgical intervention .  The patient's history has been reviewed, patient examined, no change in status, stable for surgery.  I have reviewed the patient's chart and labs.  Questions were answered to the patient's satisfaction.    Past Medical History:  Diagnosis Date  . History of kidney stones     Past Surgical History:  Procedure Laterality Date  . CESAREAN SECTION    . CYSTOSCOPY WITH STENT PLACEMENT Right 04/29/2017   Procedure: CYSTOSCOPY WITH STENT PLACEMENT;  Surgeon: Vanna ScotlandBrandon, Leander Tout, MD;  Location: [facility] ORS;  Service: Urology;  Laterality: Right;   Current Meds  Medication Sig  . docusate sodium (COLACE) 100 MG capsule Take 1 capsule (100 mg total) by mouth 2 (two) times daily.  Marland Kitchen. HYDROcodone-acetaminophen (NORCO/VICODIN) 5-325 MG tablet Take 1-2 tablets by mouth every 6 (six) hours as needed for moderate pain.  Marland Kitchen. oxybutynin (DITROPAN) 5 MG tablet Take 1 tablet (5 mg total) by mouth every 8 (eight) hours as needed for bladder spasms.  . tamsulosin (FLOMAX) 0.4 MG CAPS capsule Take 1 capsule (0.4 mg total) by mouth daily. (Patient taking differently: Take 0.4 mg by mouth every morning. )    Allergies  Allergen Reactions  . Amoxicillin Hives and Swelling    Has patient had a PCN reaction causing immediate rash, facial/tongue/throat swelling, SOB or lightheadedness with hypotension: No Has patient had a PCN reaction causing severe rash involving mucus membranes or skin necrosis: No Has patient had a PCN reaction that required hospitalization: No Has patient had a PCN  reaction occurring within the last 10 years: Yes If all of the above answers are "NO", then may proceed with Cephalosporin use.   . Sulfa Antibiotics Hives   RRR CTAB  Vanna ScotlandAshley Jamylah Marinaccio

## 2017-05-07 NOTE — Discharge Instructions (Signed)
Ureteroscopy °Ureteroscopy is a procedure to check for and treat problems inside part of the urinary tract. In this procedure, a thin, tube-shaped instrument with a light at the end (ureteroscope) is used to look at the inside of the kidneys and the ureters, which are the tubes that carry urine from the kidneys to the bladder. The ureteroscope is inserted into one or both of the ureters. °You may need this procedure if you have frequent urinary tract infections (UTIs), blood in your urine, or a stone in one of your ureters. A ureteroscopy can be done to find the cause of urine blockage in a ureter and to evaluate other abnormalities inside the ureters or kidneys. If stones are found, they can be removed during the procedure. Polyps, abnormal tissue, and some types of tumors can also be removed or treated. The ureteroscope may also have a tool to remove tissue to be checked for disease under a microscope (biopsy). °Tell a health care provider about: °· Any allergies you have. °· All medicines you are taking, including vitamins, herbs, eye drops, creams, and over-the-counter medicines. °· Any problems you or family members have had with anesthetic medicines. °· Any blood disorders you have. °· Any surgeries you have had. °· Any medical conditions you have. °· Whether you are pregnant or may be pregnant. °What are the risks? °Generally, this is a safe procedure. However, problems may occur, including: °· Bleeding. °· Infection. °· Allergic reactions to medicines. °· Scarring that narrows the ureter (stricture). °· Creating a hole in the ureter (perforation). ° °What happens before the procedure? °Staying hydrated °Follow instructions from your health care provider about hydration, which may include: °· Up to 2 hours before the procedure - you may continue to drink clear liquids, such as water, clear fruit juice, black coffee, and plain tea. ° °Eating and drinking restrictions °Follow instructions from your health care  provider about eating and drinking, which may include: °· 8 hours before the procedure - stop eating heavy meals or foods such as meat, fried foods, or fatty foods. °· 6 hours before the procedure - stop eating light meals or foods, such as toast or cereal. °· 6 hours before the procedure - stop drinking milk or drinks that contain milk. °· 2 hours before the procedure - stop drinking clear liquids. ° °Medicines °· Ask your health care provider about: °? Changing or stopping your regular medicines. This is especially important if you are taking diabetes medicines or blood thinners. °? Taking medicines such as aspirin and ibuprofen. These medicines can thin your blood. Do not take these medicines before your procedure if your health care provider instructs you not to. °· You may be given antibiotic medicine to help prevent infection. °General instructions °· You may have a urine sample taken to check for infection. °· Plan to have someone take you home from the hospital or clinic. °What happens during the procedure? °· To reduce your risk of infection: °? Your health care team will wash or sanitize their hands. °? Your skin will be washed with soap. °· An IV tube will be inserted into one of your veins. °· You will be given one of the following: °? A medicine to help you relax (sedative). °? A medicine to make you fall asleep (general anesthetic). °? A medicine that is injected into your spine to numb the area below and slightly above the injection site (spinal anesthetic). °· To lower your risk of infection, you may be given an   antibiotic medicine by an injection or through the IV tube.  The opening from which you urinate (urethra) will be cleaned with a germ-killing solution.  The ureteroscope will be passed through your urethra into your bladder.  A salt-water solution will flow through the ureteroscope to fill your bladder. This will help the health care provider see the openings of your ureters more  clearly.  Then, the ureteroscope will be passed into your ureter. ? If a growth is found, a piece of it may be removed so it can be examined under a microscope (biopsy). ? If a stone is found, it may be removed through the ureteroscope, or the stone may be broken up using a laser, shock waves, or electrical energy. ? In some cases, if the ureter is too small, a tube may be inserted that keeps the ureter open (ureteral stent). The stent may be left in place for 1 or 2 weeks to keep the ureter open, and then the ureteroscopy procedure will be performed.  The scope will be removed, and your bladder will be emptied. The procedure may vary among health care providers and hospitals. What happens after the procedure?  Your blood pressure, heart rate, breathing rate, and blood oxygen level will be monitored until the medicines you were given have worn off.  You may be asked to urinate.  Donot drive for 24 hours if you were given a sedative. This information is not intended to replace advice given to you by your health care provider. Make sure you discuss any questions you have with your health care provider. Document Released: 03/07/2013 Document Revised: 12/17/2015 Document Reviewed: 12/13/2015 Elsevier Interactive Patient Education  2018 [employer]   AMBULATORY SURGERY  DISCHARGE INSTRUCTIONS   1) The drugs that you were given will stay in your system until tomorrow so for the next 24 hours you should not:  A) Drive an automobile B) Make any legal decisions C) Drink any alcoholic beverage   2) You may resume regular meals tomorrow.  Today it is better to start with liquids and gradually work up to solid foods.  You may eat anything you prefer, but it is better to start with liquids, then soup and crackers, and gradually work up to solid foods.   3) Please notify your doctor immediately if you have any unusual bleeding, trouble breathing, redness and pain at the surgery site,  drainage, fever, or pain not relieved by medication.    4) Additional Instructions:        Please contact your physician with any problems or Same Day Surgery at [phone], Monday through Friday 6 am to 4 pm, or [place] at [facility] Main number at [phone].

## 2017-05-07 NOTE — Anesthesia Preprocedure Evaluation (Signed)
Anesthesia Evaluation  Patient identified by MRN, date of birth, ID band Patient awake    Reviewed: Allergy & Precautions, NPO status , Patient's Chart, lab work & pertinent test results  History of Anesthesia Complications Negative for: history of anesthetic complications  Airway Mallampati: III       Dental   Pulmonary neg sleep apnea, neg COPD, Current Smoker,           Cardiovascular (-) hypertension(-) Past MI and (-) CHF (-) dysrhythmias (-) Valvular Problems/Murmurs     Neuro/Psych neg Seizures    GI/Hepatic Neg liver ROS, neg GERD  ,  Endo/Other  neg diabetes  Renal/GU negative Renal ROS     Musculoskeletal   Abdominal   Peds  Hematology   Anesthesia Other Findings   Reproductive/Obstetrics                             Anesthesia Physical Anesthesia Plan  ASA: II  Anesthesia Plan: General   Post-op Pain Management:    Induction:   PONV Risk Score and Plan: 2 and Dexamethasone and Ondansetron  Airway Management Planned: LMA and Oral ETT  Additional Equipment:   Intra-op Plan:   Post-operative Plan:   Informed Consent: I have reviewed the patients History and Physical, chart, labs and discussed the procedure including the risks, benefits and alternatives for the proposed anesthesia with the patient or authorized representative who has indicated his/her understanding and acceptance.     Plan Discussed with:   Anesthesia Plan Comments:         Anesthesia Quick Evaluation

## 2017-05-07 NOTE — OR Nursing (Signed)
Discharge instructions discussed with pt and mother. Both voice understanding.

## 2017-05-07 NOTE — Transfer of Care (Signed)
Immediate Anesthesia Transfer of Care Note  Patient: Toni BayleyBrandy N Sako  Procedure(s) Performed: CYSTOSCOPY WITH URETEROSCOPY (Right ) STENT REMOVAL  Patient Location: PACU  Anesthesia Type:General  Level of Consciousness: alert , drowsy and patient cooperative  Airway & Oxygen Therapy: Patient Spontanous Breathing and Patient connected to face mask oxygen  Post-op Assessment: Report given to RN, Post -op Vital signs reviewed and stable and Patient moving all extremities X 4  Post vital signs: Reviewed and stable  Last Vitals:  Vitals:   05/07/17 0615 05/07/17 0814  BP: (!) 142/92 125/70  Pulse: 90 86  Resp: 18 14  Temp: 36.5 C 37.3 C  SpO2: 99% 100%    Last Pain:  Vitals:   05/07/17 0814  TempSrc:   PainSc: Asleep         Complications: No apparent anesthesia complications

## 2017-05-07 NOTE — Anesthesia Post-op Follow-up Note (Signed)
Anesthesia QCDR form completed.        

## 2017-05-07 NOTE — Anesthesia Procedure Notes (Signed)
Procedure Name: LMA Insertion Date/Time: 05/07/2017 7:52 AM Performed by: Sherol DadeMacMang, Maika Mcelveen H, CRNA Pre-anesthesia Checklist: Patient identified, Emergency Drugs available, Suction available, Patient being monitored and Timeout performed Patient Re-evaluated:Patient Re-evaluated prior to induction Oxygen Delivery Method: Circle system utilized Preoxygenation: Pre-oxygenation with 100% oxygen Induction Type: IV induction Ventilation: Mask ventilation without difficulty LMA: LMA inserted LMA Size: 3.5 Number of attempts: 1 Placement Confirmation: positive ETCO2,  CO2 detector and breath sounds checked- equal and bilateral Tube secured with: Tape Dental Injury: Teeth and Oropharynx as per pre-operative assessment

## 2017-05-07 NOTE — Op Note (Signed)
Date of procedure: 05/07/17  Preoperative diagnosis:  1. Right distal ureteral stone  Postoperative diagnosis:  1. No stone identified  Procedure: 1. Right ureteroscopy 2. Right ureteral stent removal  Surgeon: Vanna ScotlandAshley Danetra Glock, MD  Anesthesia: General  Complications: None  Intraoperative findings: Negative ureteroscopy.  Stent removed.  EBL: Minimal  Specimens: None  Drains: None  Indication: Toni Salazar is a 32 y.o. patient with a 4 mm right UVJ stone presenting with urosepsis who underwent emergent ureteral stent placement.  She returns today for definitive management of her stone.  After reviewing the management options for treatment, she elected to proceed with the above surgical procedure(s). We have discussed the potential benefits and risks of the procedure, side effects of the proposed treatment, the likelihood of the patient achieving the goals of the procedure, and any potential problems that might occur during the procedure or recuperation. Informed consent has been obtained.  Description of procedure:  The patient was taken to the operating room and general anesthesia was induced.  The patient was placed in the dorsal lithotomy position, prepped and draped in the usual sterile fashion, and preoperative antibiotics were administered. A preoperative time-out was performed.   A 21 French scope was advanced per urethra into the bladder.  Attention was turned to the right ureteral orifice from which a ureteral stent was seen emanating.  The distal coil of the stent was grasped and brought to level of the urethral meatus.  This was then cannulated using a sensor wire.  The sensor wire was left in place and the stent was removed.  This was snapped in place as a safety wire.  A 4.5 semirigid ureteroscope was advanced alongside of the stent all the way up to the level of the UPJ and no ureteral stones were identified.  This scope was then slowly backed down the length of the  ureter inspecting along the way.  There was minimal edema, the ureter was widely patent, and no stones or lesions were identified.  Just within the very distal ureter, there was a small amount of irritation which is presumably where the stone had been previously located.  There is no significant edema at this level.  As such, the decision was made to not replace the stent.  But the scope and the wire removed.  The patient was then cleaned and dried, repositioned in supine position, reversed from anesthesia, taken to PACU in stable condition.  Plan: She will follow-up in 4 weeks with renal ultrasound.  Vanna ScotlandAshley Natoya Viscomi, M.D.

## 2017-06-01 ENCOUNTER — Ambulatory Visit: Payer: Medicaid Other | Admitting: Urology

## 2017-06-04 ENCOUNTER — Ambulatory Visit
Admission: RE | Admit: 2017-06-04 | Discharge: 2017-06-04 | Disposition: A | Discharge status: Home / Self Care | Payer: Medicaid Other | Source: Ambulatory Visit | Attending: Urology | Admitting: Urology

## 2017-06-04 DIAGNOSIS — N201 Calculus of ureter: Secondary | ICD-10-CM

## 2017-06-15 ENCOUNTER — Ambulatory Visit (INDEPENDENT_AMBULATORY_CARE_PROVIDER_SITE_OTHER): Payer: Self-pay | Admitting: Urology

## 2017-06-15 ENCOUNTER — Encounter: Payer: Self-pay | Admitting: Urology

## 2017-06-15 VITALS — BP 124/70 | HR 96 | Ht 62.0 in | Wt 190.0 lb

## 2017-06-15 DIAGNOSIS — Z87442 Personal history of urinary calculi: Secondary | ICD-10-CM

## 2017-06-15 NOTE — Progress Notes (Signed)
06/15/2017 2:31 PM   Toni BienenstockBrandy Sherrie MustacheN Salazar 24-Jun-1985 [id number]  Referring provider: Evelene CroonNiemeyer, Meindert, MD [address] [place], [place] [id number]  Chief Complaint  Patient presents with  . Nephrolithiasis    1 month RUS    HPI: 32 year old female who returns today for routine follow-up after kidney stones.  She eventually presented to the emergency room on 04/29/2017 in the setting of an obstructing 4 mm right UVJ stone, leukocytosis, and UTI.  She underwent urgent ureteral stent placement.  She returned to the operating room approximately 1 week later for ureteroscopy at which time no stone was identified.  Her stent was subsequently removed.  Follow-up renal ultrasound shows no evidence of hydronephrosis bilaterally no further stone burden.  No stone analysis for review.  She is no previous history of stone disease.  She denies any urinary symptoms today.  No flank pain or gross hematuria.   PMH: Past Medical History:  Diagnosis Date  . History of kidney stones     Surgical History: Past Surgical History:  Procedure Laterality Date  . CESAREAN SECTION    . CYSTOSCOPY WITH STENT PLACEMENT Right 04/29/2017   Procedure: CYSTOSCOPY WITH STENT PLACEMENT;  Surgeon: Vanna ScotlandBrandon, Eupha Lobb, MD;  Location: [facility];  Service: Urology;  Laterality: Right;  . CYSTOSCOPY WITH URETEROSCOPY Right 05/07/2017   Procedure: CYSTOSCOPY WITH URETEROSCOPY;  Surgeon: Vanna ScotlandBrandon, Benuel Ly, MD;  Location: [facility];  Service: Urology;  Laterality: Right;  . STENT REMOVAL  05/07/2017   Procedure: STENT REMOVAL;  Surgeon: Vanna ScotlandBrandon, Alveria Mcglaughlin, MD;  Location: [facility];  Service: Urology;;    Home Medications:  Allergies as of 06/15/2017      Reactions   Amoxicillin Hives, Swelling   Has patient had a PCN reaction causing immediate rash, facial/tongue/throat swelling, SOB or lightheadedness with hypotension: No Has patient had a PCN reaction causing severe rash involving mucus membranes or skin necrosis: No Has  patient had a PCN reaction that required hospitalization: No Has patient had a PCN reaction occurring within the last 10 years: Yes If all of the above answers are "NO", then may proceed with Cephalosporin use.   Sulfa Antibiotics Hives      Medication List        Accurate as of 06/15/17  2:31 PM. Always use your most recent med list.          ADVIL PM 200-25 MG Caps Generic drug:  Ibuprofen-diphenhydrAMINE HCl Take 2 tablets by mouth at bedtime.       Allergies:  Allergies  Allergen Reactions  . Amoxicillin Hives and Swelling    Has patient had a PCN reaction causing immediate rash, facial/tongue/throat swelling, SOB or lightheadedness with hypotension: No Has patient had a PCN reaction causing severe rash involving mucus membranes or skin necrosis: No Has patient had a PCN reaction that required hospitalization: No Has patient had a PCN reaction occurring within the last 10 years: Yes If all of the above answers are "NO", then may proceed with Cephalosporin use.   . Sulfa Antibiotics Hives    Family History: History reviewed. No pertinent family history.  Social History:  reports that she has been smoking cigarettes.  She has never used smokeless tobacco. She reports that she drinks alcohol. She reports that she does not use drugs.  ROS: UROLOGY Frequent Urination?: No Hard to postpone urination?: No Burning/pain with urination?: No Get up at night to urinate?: No Leakage of urine?: No Urine stream starts and stops?: No Trouble starting stream?: No Do you  have to strain to urinate?: No Blood in urine?: No Urinary tract infection?: No Sexually transmitted disease?: No Injury to kidneys or bladder?: No Painful intercourse?: No Weak stream?: No Currently pregnant?: No Vaginal bleeding?: No Last menstrual period?: Toni  Gastrointestinal Nausea?: No Vomiting?: No Indigestion/heartburn?: No Diarrhea?: No Constipation?: No  Constitutional Fever: No Night  sweats?: No Weight loss?: No Fatigue?: No  Skin Skin rash/lesions?: No Itching?: No  Eyes Blurred vision?: No Double vision?: No  Ears/Nose/Throat Sore throat?: No Sinus problems?: No  Hematologic/Lymphatic Swollen glands?: No Easy bruising?: No  Cardiovascular Leg swelling?: No Chest pain?: No  Respiratory Cough?: No Shortness of breath?: No  Endocrine Excessive thirst?: No  Musculoskeletal Back pain?: No Joint pain?: No  Neurological Headaches?: No Dizziness?: No  Psychologic Depression?: No Anxiety?: No  Physical Exam: BP 124/70   Pulse 96   Ht 5\' 2"  (1.575 m)   Wt 190 lb (86.2 kg)   BMI 34.75 kg/m   Constitutional:  Alert and oriented, No acute distress. HEENT: [place] AT, moist mucus membranes.  Trachea midline, no masses. Cardiovascular: No clubbing, cyanosis, or edema. Respiratory: Normal respiratory effort, no increased work of breathing. Skin: No rashes, bruises or suspicious lesions. Neurologic: Grossly intact, no focal deficits, moving all 4 extremities. Psychiatric: Normal mood and affect.  Laboratory Data: Lab Results  Component Value Date   WBC 20.6 (H) 04/28/2017   HGB 14.2 04/28/2017   HCT 42.6 04/28/2017   MCV 90.5 04/28/2017   PLT 309 04/28/2017    Lab Results  Component Value Date   CREATININE 0.85 04/28/2017   Urinalysis Toni/a  Pertinent Imaging:   Narrative    CLINICAL DATA: 32 year old female with history of a right ureteral stone, status post right URS.  EXAM: RENAL / URINARY TRACT ULTRASOUND COMPLETE  COMPARISON: Prior CT from 04/28/2017  FINDINGS: Right Kidney:  Length: 11.1 cm. Echogenicity within normal limits. No mass or hydronephrosis visualized.  Left Kidney:  Length: 11.5 cm. Echogenicity within normal limits. No mass or hydronephrosis visualized.  Bladder:  Appears normal for degree of bladder distention. Bilateral ureteral jets are visualized.  IMPRESSION: Normal renal ultrasound with  interval resolution of previously seen right-sided hydroureteronephrosis. No hydronephrosis now identified.   Electronically Signed By: Rise Mu M.D. On: 06/04/2017 21:52    Renal ultrasound personally reviewed today  Assessment & Plan:    1. History of nephrolithiasis  Status post ureteroscopy  Renal ultrasound without any iatrogenic residual hydronephrosis or stone burden  We discussed general stone prevention techniques including drinking plenty water with goal of producing 2.5 L urine daily, increased citric acid intake, avoidance of high oxalate containing foods, and decreased salt intake.  Information about dietary recommendations given today.    Return if symptoms worsen or fail to improve, for please provide with note to allow water at work.    Vanna Scotland, MD  [facility] [address], [address] [place], [place] [address] [phone]

## 2017-07-06 ENCOUNTER — Ambulatory Visit: Payer: Medicaid Other | Admitting: Urology

## 2018-09-20 IMAGING — US US RENAL
2 series · 14 of 25 positions shown · non-contrast
Comparison: Prior CT from 04/28/2017

CLINICAL DATA: 31-year-old female with history of a right ureteral
stone, status post right KOCIARA.

EXAM:
RENAL / URINARY TRACT ULTRASOUND COMPLETE

[Series 1: us renal · 50 acquisitions, 13 frames shown (1 of 2)]
[im 1/50]
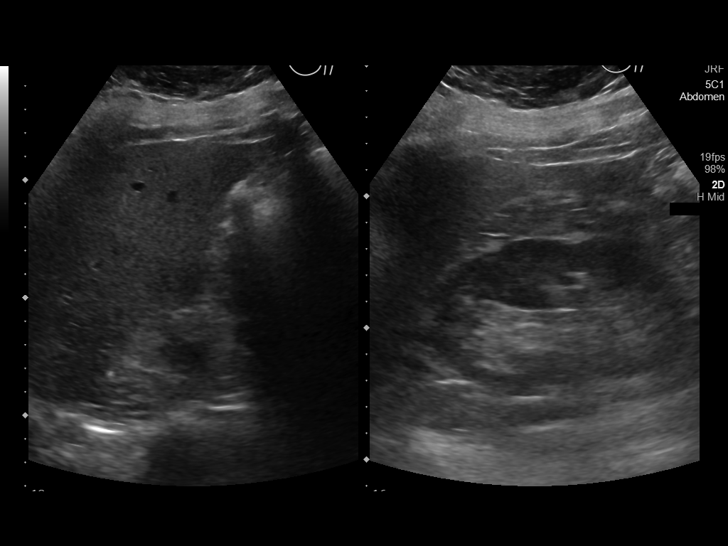
[im 5/50]
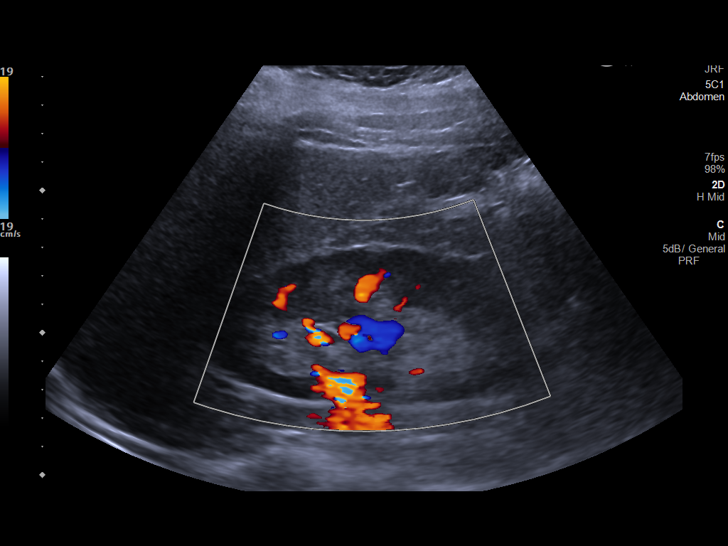
[im 9/50]
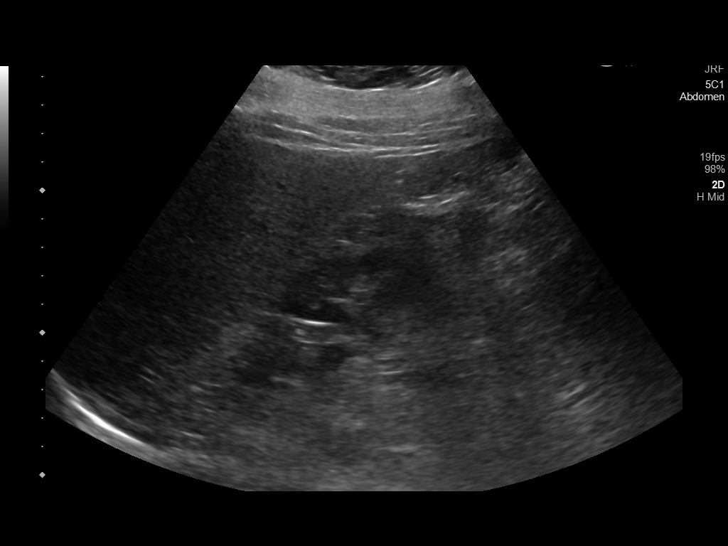
[im 14/50]
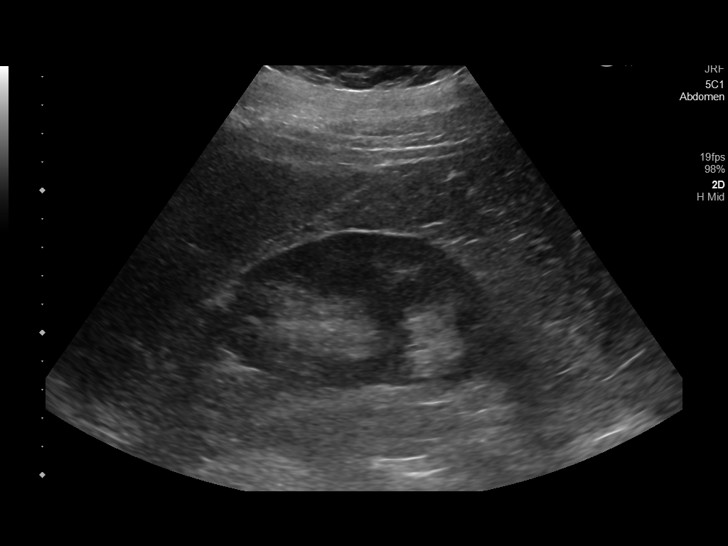
[im 18/50]
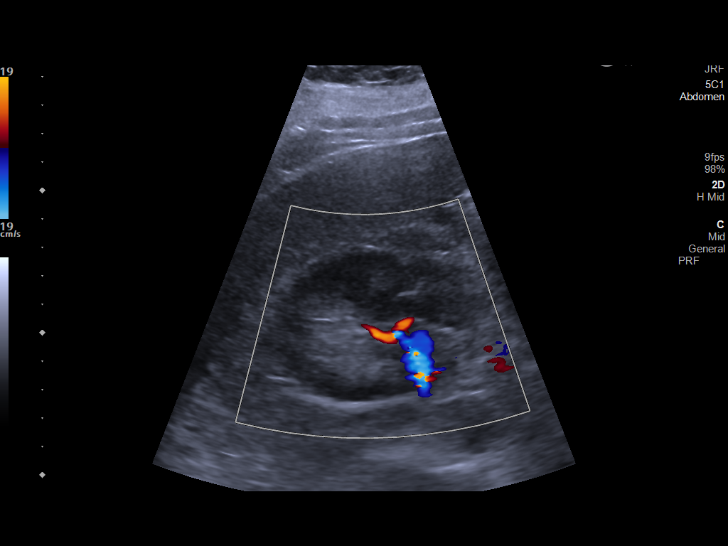
[im 21/50]
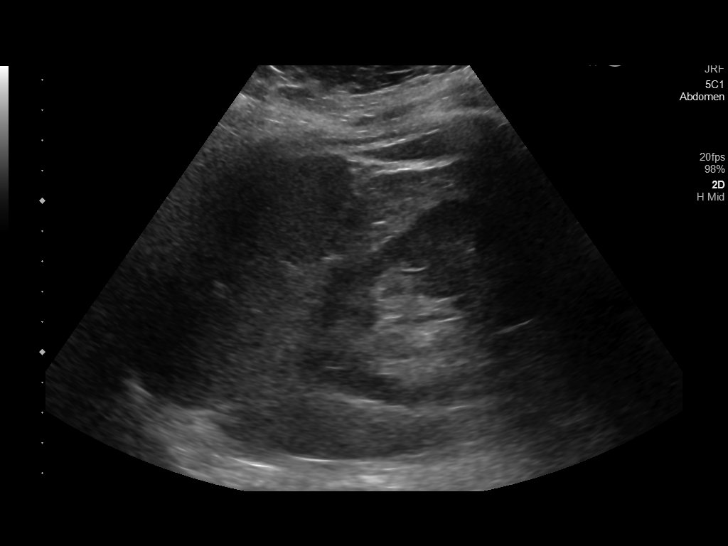
[im 25/50]
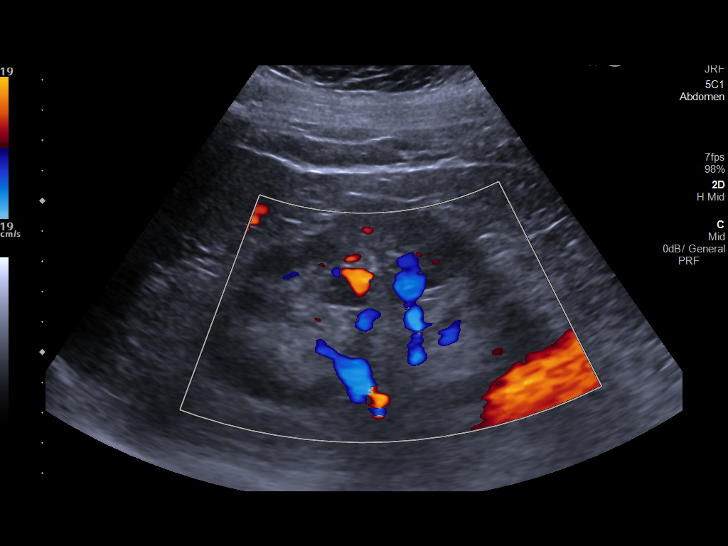
[im 29/50]
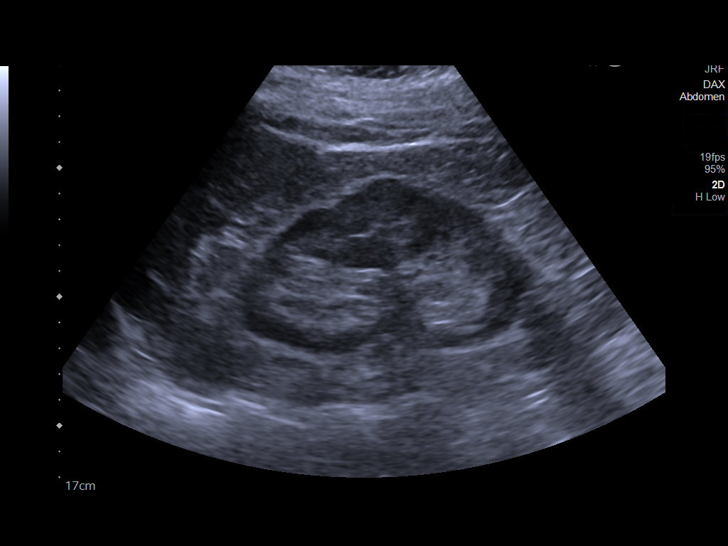
[im 34/50]
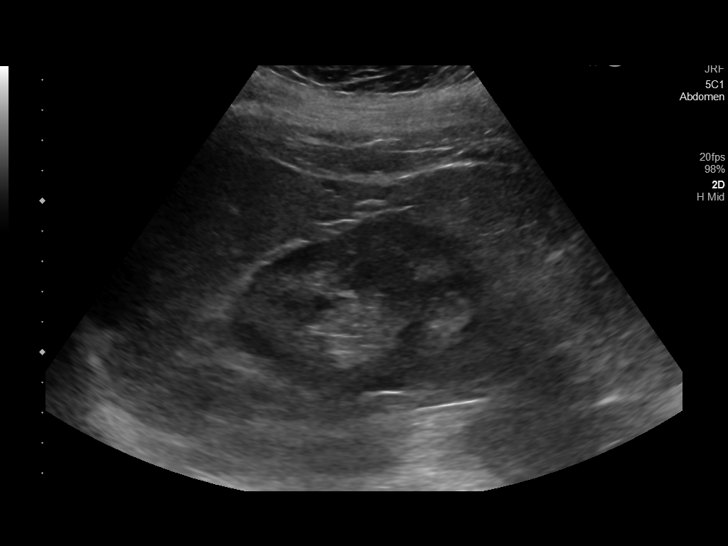
[im 36/50]
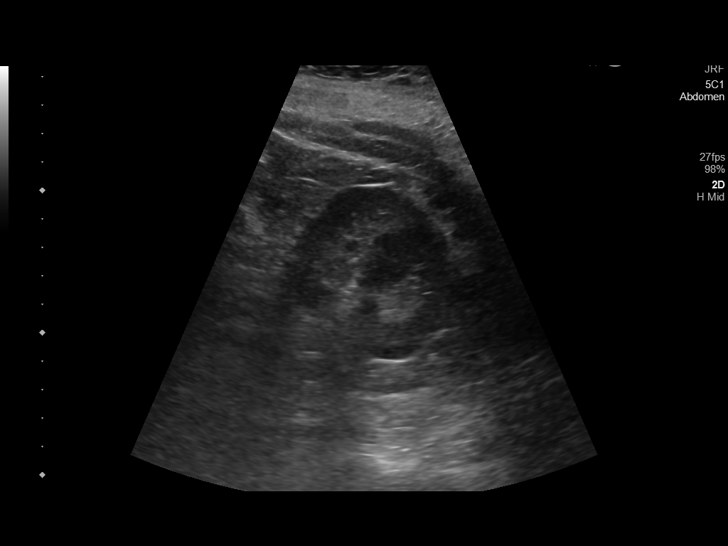
[im 41/50]
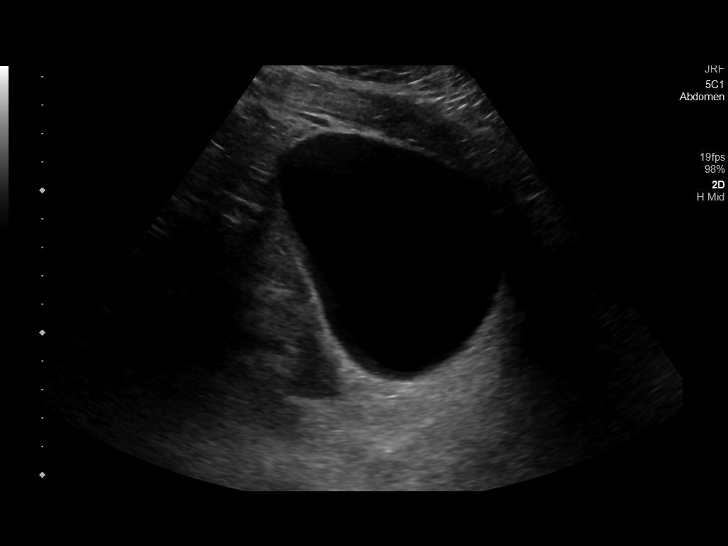
[im 45/50]
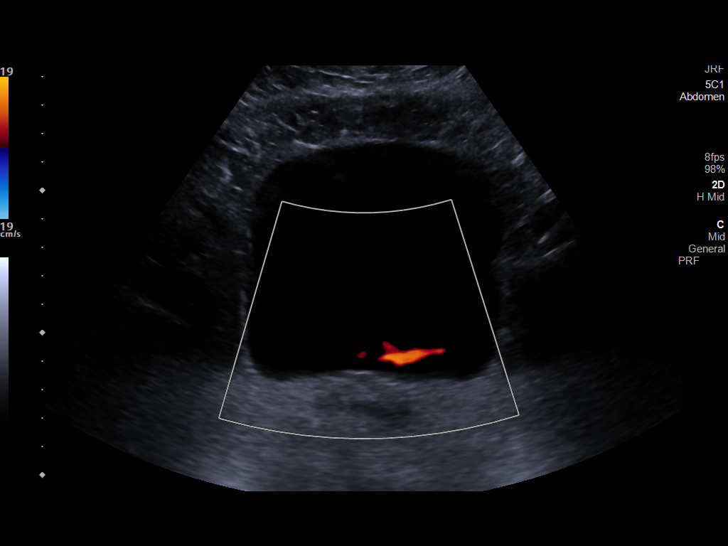
[im 50/50]
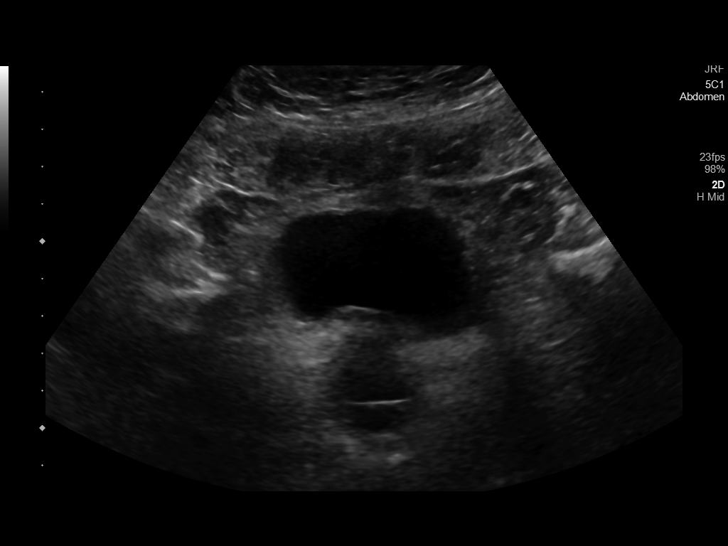

[Series 1001: us renal · 1 of 4 slices shown (2 of 2)]
[im 4/4]
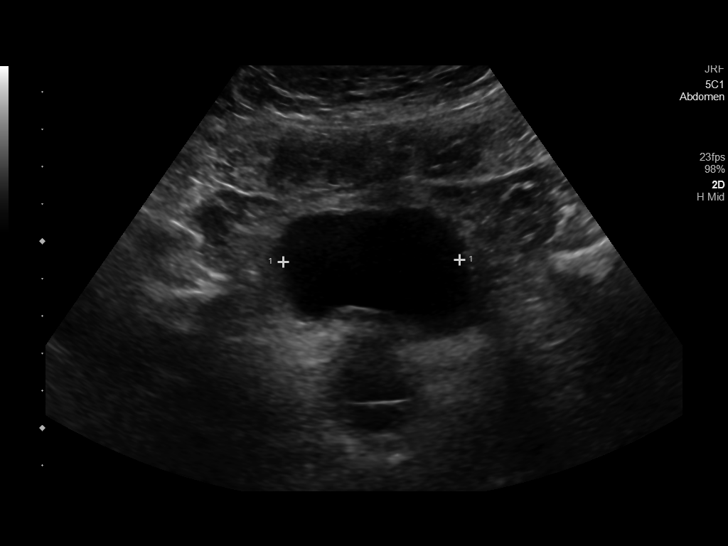

[14 of 25 positions shown; findings below may reference images not displayed]

FINDINGS: Right Kidney:

Length: 11.1 cm. Echogenicity within normal limits. No mass or
hydronephrosis visualized.

Left Kidney:

Length: 11.5 cm. Echogenicity within normal limits. No mass or
hydronephrosis visualized.

Bladder:

Appears normal for degree of bladder distention. Bilateral ureteral
jets are visualized.
IMPRESSION: Normal renal ultrasound with interval resolution of previously seen
right-sided hydroureteronephrosis. No hydronephrosis now identified.

## 2018-12-15 ENCOUNTER — Other Ambulatory Visit: Payer: Self-pay

## 2018-12-15 ENCOUNTER — Encounter: Payer: Self-pay | Admitting: Emergency Medicine

## 2018-12-15 ENCOUNTER — Emergency Department: Payer: Self-pay

## 2018-12-15 ENCOUNTER — Emergency Department
Admission: EM | Admit: 2018-12-15 | Discharge: 2018-12-15 | Disposition: A | Discharge status: Home / Self Care | Payer: Self-pay | Attending: Emergency Medicine | Admitting: Emergency Medicine

## 2018-12-15 DIAGNOSIS — N200 Calculus of kidney: Secondary | ICD-10-CM | POA: Insufficient documentation

## 2018-12-15 DIAGNOSIS — F17210 Nicotine dependence, cigarettes, uncomplicated: Secondary | ICD-10-CM | POA: Insufficient documentation

## 2018-12-15 DIAGNOSIS — Z79899 Other long term (current) drug therapy: Secondary | ICD-10-CM | POA: Insufficient documentation

## 2018-12-15 DIAGNOSIS — R300 Dysuria: Secondary | ICD-10-CM | POA: Insufficient documentation

## 2018-12-15 LAB — POCT PREGNANCY, URINE: Preg Test, Ur: NEGATIVE

## 2018-12-15 LAB — CBC
HCT: 40.1 % (ref 36.0–46.0)
Hemoglobin: 12.9 g/dL (ref 12.0–15.0)
MCH: 28.1 pg (ref 26.0–34.0)
MCHC: 32.2 g/dL (ref 30.0–36.0)
MCV: 87.4 fL (ref 80.0–100.0)
Platelets: 335 10*3/uL (ref 150–400)
RBC: 4.59 MIL/uL (ref 3.87–5.11)
RDW: 15.3 % (ref 11.5–15.5)
WBC: 16.5 10*3/uL — ABNORMAL HIGH (ref 4.0–10.5)
nRBC: 0 % (ref 0.0–0.2)

## 2018-12-15 LAB — URINALYSIS, COMPLETE (UACMP) WITH MICROSCOPIC
Bilirubin Urine: NEGATIVE
Glucose, UA: NEGATIVE mg/dL
Ketones, ur: NEGATIVE mg/dL
Nitrite: NEGATIVE
Protein, ur: 30 mg/dL — AB
RBC / HPF: >50 RBC/hpf — ABNORMAL HIGH (ref 0–5)
Specific Gravity, Urine: 1.021 (ref 1.005–1.030)
pH: 6 (ref 5.0–8.0)

## 2018-12-15 LAB — BASIC METABOLIC PANEL
Anion gap: 10 (ref 5–15)
BUN: 13 mg/dL (ref 6–20)
CO2: 22 mmol/L (ref 22–32)
Calcium: 9.1 mg/dL (ref 8.9–10.3)
Chloride: 104 mmol/L (ref 98–111)
Creatinine, Ser: 0.66 mg/dL (ref 0.44–1.00)
GFR calc Af Amer: >60 mL/min (ref >60)
GFR calc non Af Amer: >60 mL/min (ref >60)
Glucose, Bld: 108 mg/dL — ABNORMAL HIGH (ref 70–99)
Potassium: 3.8 mmol/L (ref 3.5–5.1)
Sodium: 136 mmol/L (ref 135–145)

## 2018-12-15 MED ORDER — KETOROLAC TROMETHAMINE 30 MG/ML IJ SOLN
30.0000 mg | Freq: Once | INTRAMUSCULAR | Status: AC
  Administered 2018-12-15: 21:00:00 30 mg via INTRAMUSCULAR
  Filled 2018-12-15: qty 1

## 2018-12-15 MED ORDER — OXYCODONE-ACETAMINOPHEN 5-325 MG PO TABS: 1.0000 | ORAL_TABLET | ORAL | 0 refills | Status: AC | PRN

## 2018-12-15 MED ORDER — PROMETHAZINE HCL 25 MG RE SUPP: 25.0000 mg | Freq: Four times a day (QID) | RECTAL | 1 refills | Status: DC | PRN

## 2018-12-15 MED ORDER — KETOROLAC TROMETHAMINE 30 MG/ML IJ SOLN: 30.0000 mg | Freq: Once | INTRAMUSCULAR | Status: DC

## 2018-12-15 MED ORDER — PROMETHAZINE HCL 25 MG RE SUPP: 25.0000 mg | Freq: Four times a day (QID) | RECTAL | Status: DC | PRN

## 2018-12-15 MED ORDER — SODIUM CHLORIDE 0.9 % IV BOLUS: 1000.0000 mL | Freq: Once | INTRAVENOUS | Status: DC

## 2018-12-15 MED ORDER — SODIUM CHLORIDE 0.9 % IV BOLUS
1000.0000 mL | Freq: Once | INTRAVENOUS | Status: AC
  Administered 2018-12-15: 19:00:00 1000 mL via INTRAVENOUS

## 2018-12-15 MED ORDER — METOCLOPRAMIDE HCL 5 MG/ML IJ SOLN
10.0000 mg | Freq: Once | INTRAMUSCULAR | Status: DC
  Filled 2018-12-15: qty 2

## 2018-12-15 MED ORDER — PROMETHAZINE HCL 25 MG RE SUPP
25.0000 mg | Freq: Once | RECTAL | Status: AC
  Administered 2018-12-15: 21:00:00 25 mg via RECTAL
  Filled 2018-12-15: qty 1

## 2018-12-15 MED ORDER — OXYCODONE-ACETAMINOPHEN 5-325 MG PO TABS
1.0000 | ORAL_TABLET | ORAL | Status: DC | PRN
  Administered 2018-12-15: 18:00:00 1 via ORAL
  Filled 2018-12-15: qty 1

## 2018-12-15 MED ORDER — ONDANSETRON HCL 4 MG/2ML IJ SOLN
4.0000 mg | Freq: Once | INTRAMUSCULAR | Status: AC
  Administered 2018-12-15: 20:00:00 4 mg via INTRAVENOUS
  Filled 2018-12-15: qty 2

## 2018-12-15 MED ORDER — MORPHINE SULFATE (PF) 4 MG/ML IV SOLN: 4.0000 mg | Freq: Once | INTRAVENOUS | Status: DC

## 2018-12-15 MED ORDER — HYDROMORPHONE HCL 1 MG/ML IJ SOLN
1.0000 mg | Freq: Once | INTRAMUSCULAR | Status: AC
  Administered 2018-12-15: 19:00:00 1 mg via INTRAVENOUS
  Filled 2018-12-15: qty 1

## 2018-12-15 NOTE — Discharge Instructions (Addendum)
Follow-up with Dr. Erlene Quan.  Please call for appointment.  She has been notified that you were in the emergency department with another kidney stone.  Take pain medication as needed.  Nausea medication as needed.  Return emergency department if worsening.

## 2018-12-15 NOTE — ED Triage Notes (Signed)
Pt in via POV, reports sever right lower back pain and dysuria x one day.  Pt reports hx of stent placement in kidney with similar pain.  Pt appears uncomfortable, tearful in triage.

## 2018-12-15 NOTE — ED Provider Notes (Addendum)
[facility] Emergency Department Provider Note  ____________________________________________   First MD Initiated Contact with Patient 12/15/18 1835     (approximate)  I have reviewed the triage vital signs and the nursing notes.   HISTORY  Chief Complaint Flank Pain    HPI Toni Salazar is a 33 y.o. female presents emergency department complaining of right-sided lower back pain and dysuria for 1 day.  Patient states when she gave her urine sample in the lobby that she did see a kidney stone in it.  She has a history of a stent placement which they did remove later due to infection.  History of kidney stones.  She denies any vomiting or diarrhea.  She denies any fever or chills at this time.    Past Medical History:  Diagnosis Date  . History of kidney stones     Patient Active Problem List   Diagnosis Date Noted  . Right ureteral stone 04/29/2017    Past Surgical History:  Procedure Laterality Date  . CESAREAN SECTION    . CYSTOSCOPY WITH STENT PLACEMENT Right 04/29/2017   Procedure: CYSTOSCOPY WITH STENT PLACEMENT;  Surgeon: Hollice Espy, MD;  Location: [facility];  Service: Urology;  Laterality: Right;  . CYSTOSCOPY WITH URETEROSCOPY Right 05/07/2017   Procedure: CYSTOSCOPY WITH URETEROSCOPY;  Surgeon: Hollice Espy, MD;  Location: [facility] OR[facility];  Service: Urology;  Laterality: Right;  . STENT REMOVAL  05/07/2017   Procedure: STENT REMOVAL;  Surgeon: Hollice Espy, MD;  Location: [facility] ORS;  Service: Urology;;    Prior to Admission medications   Medication Sig Start Date End Date Taking? Authorizing Provider  Ibuprofen-Diphenhydramine HCl (ADVIL PM) 200-25 MG CAPS Take 2 tablets by mouth at bedtime.     [provider]  oxyCODONE-acetaminophen (PERCOCET) 5-325 MG tablet Take 1 tablet by mouth every 4 (four) hours as needed for severe pain. 12/15/18 12/15/19  Fisher, Linden Dolin, PA-C  promethazine (PHENERGAN) 25 MG suppository Place 1  suppository (25 mg total) rectally every 6 (six) hours as needed for nausea. 12/15/18 12/15/19  Versie Starks, PA-C    Allergies Amoxicillin and Sulfa antibiotics  No family history on file.  Social History Social History   Tobacco Use  . Smoking status: Current Every Day Smoker    Types: Cigarettes  . Smokeless tobacco: Never Used  Substance Use Topics  . Alcohol use: Yes  . Drug use: No    Review of Systems  Constitutional: No fever/chills Eyes: No visual changes. ENT: No sore throat. Respiratory: Denies cough Gastrointestinal: Positive for right-sided flank pain genitourinary: Negative for dysuria. Musculoskeletal: Negative for back pain. Skin: Negative for rash.    ____________________________________________   PHYSICAL EXAM:  VITAL SIGNS: ED Triage Vitals  Enc Vitals Group     BP 12/15/18 1738 (!) 189/108     Pulse Rate 12/15/18 1738 97     Resp 12/15/18 1738 16     Temp 12/15/18 1738 98.4 F (36.9 C)     Temp Source 12/15/18 1738 Oral     SpO2 12/15/18 1738 97 %     Weight 12/15/18 1740 220 lb (99.8 kg)     Height 12/15/18 1740 5\' 3"  (1.6 m)     Head Circumference --      Peak Flow --      Pain Score 12/15/18 1739 8     Pain Loc --      Pain Edu? --      Excl. in [place]? --  Constitutional: Alert and oriented. Well appearing and in mild distress due to discomfort  eyes: Conjunctivae are normal.  Head: Atraumatic. Nose: No congestion/rhinnorhea. Mouth/Throat: Mucous membranes are moist.   Neck:  supple no lymphadenopathy noted Cardiovascular: Normal rate, regular rhythm. Heart sounds are normal Respiratory: Normal respiratory effort.  No retractions, lungs c t a  Abd: soft tender in the right flank area, bs normal all 4 quad GU: deferred Musculoskeletal: FROM all extremities, warm and well perfused Neurologic:  Normal speech and language.  Skin:  Skin is warm, dry and intact. No rash noted. Psychiatric: Mood and affect are normal. Speech and  behavior are normal.  ____________________________________________   LABS (all labs ordered are listed, but only abnormal results are displayed)  Labs Reviewed  URINALYSIS, COMPLETE (UACMP) WITH MICROSCOPIC - Abnormal; Notable for the following components:      Result Value   Color, Urine YELLOW (*)    APPearance CLOUDY (*)    Hgb urine dipstick LARGE (*)    Protein, ur 30 (*)    Leukocytes,Ua TRACE (*)    RBC / HPF >50 (*)    Bacteria, UA RARE (*)    All other components within normal limits  BASIC METABOLIC PANEL - Abnormal; Notable for the following components:   Glucose, Bld 108 (*)    All other components within normal limits  CBC - Abnormal; Notable for the following components:   WBC 16.5 (*)    All other components within normal limits  POC URINE PREG, ED  POCT PREGNANCY, URINE   ____________________________________________   ____________________________________________  RADIOLOGY  CT renal stone study shows hydronephrosis and a 3 mm nonobstructing stone in the bladder lumen.  No obstructing stones are noted per the radiologist.  ____________________________________________   PROCEDURES  Procedure(s) performed: Saline lock, Dilaudid 1 mg IV, normal saline 1 L IV, Toradol 30 mg IM, phenergan 25mg  supp (pts iv fell out Procedures    ____________________________________________   INITIAL IMPRESSION / ASSESSMENT AND PLAN / ED COURSE  Pertinent labs & imaging results that were available during my care of the patient were reviewed by me and considered in my medical decision making (see chart for details).   Patient is 33 year old female presents emergency department with concerns of right lower back pain along with dysuria.  See HPI  Physical exam shows patient appears quite uncomfortable.  Right sided flank area is tender to palpation.  Remainder the exam is unremarkable  CBC is elevated WBC of 16.5, urinalysis has large amount of hemoglobin, trace of  leuks, greater than 50 RBCs, and rare bacteria.  Basic metabolic panel is negative, PSA Prag is negative  CT renal stone study Saline lock, Dilaudid 1 mg IV, normal saline 1 L IV,  Zofran 4 mg IV,  Toradol 30 mg Im, phenergan supp  Discussed the CT results and the labs with Dr. Richardo HanksSninsky ( urologist).  Explained to him that the WBCs were 16.5 and she had few bacteria in her urine but no white blood cell clumps or WBCs elevation.  Small amount of leuks but mostly blood.  He does not feel patient needs to be on antibiotic at this time.  He does want close follow-up in his office.  Chart was sent to him via secure text.  Patient was given more pain medication and nausea medication for pain control.  She is to follow-up with urology.    Patient has continued vomiting.  She is not at her Phenergan suppository or Toradol at this  time.  She will be observed by Melissa Noon, PA-C.  If she needs to be admitted for pain control she will discuss with the hospitalist.  Urology has already been consulted and does not feel that as far as urology is concerned that she needs to be admitted for procedure.   LESHEA JAGGERS was evaluated in Emergency Department on 12/15/2018 for the symptoms described in the history of present illness. She was evaluated in the context of the global COVID-19 pandemic, which necessitated consideration that the patient might be at risk for infection with the SARS-CoV-2 virus that causes COVID-19. Institutional protocols and algorithms that pertain to the evaluation of patients at risk for COVID-19 are in a state of rapid change based on information released by regulatory bodies including the CDC and federal and state organizations. These policies and algorithms were followed during the patient's care in the ED.   As part of my medical decision making, I reviewed the following data within the electronic MEDICAL RECORD NUMBER Nursing notes reviewed and incorporated, Labs reviewed see above, Old  EKG reviewed, Radiograph reviewed see above, A consult was requested and obtained from this/these consultant(s) Urology, Notes from prior ED visits and  Controlled Substance Database  ____________________________________________   FINAL CLINICAL IMPRESSION(S) / ED DIAGNOSES  Final diagnoses:  Kidney stone      NEW MEDICATIONS STARTED DURING THIS VISIT:  New Prescriptions   OXYCODONE-ACETAMINOPHEN (PERCOCET) 5-325 MG TABLET    Take 1 tablet by mouth every 4 (four) hours as needed for severe pain.   PROMETHAZINE (PHENERGAN) 25 MG SUPPOSITORY    Place 1 suppository (25 mg total) rectally every 6 (six) hours as needed for nausea.     Note:  This document was prepared using Dragon voice recognition software and may include unintentional dictation errors.    Faythe Ghee, PA-C 12/15/18 2046    Faythe Ghee, PA-C 12/15/18 2055    Sharman Cheek, MD 12/17/18 [phone]

## 2018-12-16 ENCOUNTER — Telehealth: Payer: Self-pay | Admitting: Urology

## 2018-12-16 NOTE — Telephone Encounter (Signed)
Spoke with patient she said she doesn't have insurance and she work 7-3:30 everyday and will need to call us back later when she decides if she wants to schedule or not.   Sharyn Lull

## 2018-12-16 NOTE — Telephone Encounter (Signed)
-----   Message from Billey Co, MD sent at 12/15/2018  8:35 PM EDT ----- Regarding: follow up Please set up follow up with Dr. Erlene Quan or Sam in 2-4 weeks for KUB and discussion of non-obstructing right renal stone, thanks  Nickolas Madrid, MD 12/15/2018

## 2018-12-30 ENCOUNTER — Ambulatory Visit: Payer: Medicaid Other | Admitting: Physician Assistant

## 2019-12-14 ENCOUNTER — Emergency Department: Payer: Medicaid Other

## 2019-12-14 ENCOUNTER — Encounter
Admission: EM | Disposition: A | Discharge status: Home / Self Care | Payer: Self-pay | Source: Home / Self Care | Attending: Emergency Medicine

## 2019-12-14 ENCOUNTER — Emergency Department: Payer: Medicaid Other | Admitting: Anesthesiology

## 2019-12-14 ENCOUNTER — Other Ambulatory Visit: Payer: Self-pay

## 2019-12-14 ENCOUNTER — Ambulatory Visit
Admission: EM | Admit: 2019-12-14 | Discharge: 2019-12-14 | Disposition: A | Discharge status: Home / Self Care | Payer: Medicaid Other | Attending: Emergency Medicine | Admitting: Emergency Medicine

## 2019-12-14 ENCOUNTER — Encounter: Payer: Self-pay | Admitting: Emergency Medicine

## 2019-12-14 DIAGNOSIS — D72829 Elevated white blood cell count, unspecified: Secondary | ICD-10-CM | POA: Insufficient documentation

## 2019-12-14 DIAGNOSIS — R109 Unspecified abdominal pain: Secondary | ICD-10-CM | POA: Diagnosis present

## 2019-12-14 DIAGNOSIS — N201 Calculus of ureter: Secondary | ICD-10-CM

## 2019-12-14 DIAGNOSIS — Z87442 Personal history of urinary calculi: Secondary | ICD-10-CM | POA: Diagnosis not present

## 2019-12-14 DIAGNOSIS — N132 Hydronephrosis with renal and ureteral calculous obstruction: Secondary | ICD-10-CM | POA: Insufficient documentation

## 2019-12-14 DIAGNOSIS — N200 Calculus of kidney: Secondary | ICD-10-CM

## 2019-12-14 DIAGNOSIS — Z20822 Contact with and (suspected) exposure to covid-19: Secondary | ICD-10-CM | POA: Diagnosis not present

## 2019-12-14 DIAGNOSIS — F17210 Nicotine dependence, cigarettes, uncomplicated: Secondary | ICD-10-CM | POA: Diagnosis not present

## 2019-12-14 HISTORY — PX: CYSTOSCOPY/URETEROSCOPY/HOLMIUM LASER/STENT PLACEMENT: SHX6546

## 2019-12-14 HISTORY — PX: CYSTOSCOPY W/ RETROGRADES: SHX1426

## 2019-12-14 LAB — COMPREHENSIVE METABOLIC PANEL
ALT: 20 U/L (ref 0–44)
AST: 30 U/L (ref 15–41)
Albumin: 4.2 g/dL (ref 3.5–5.0)
Alkaline Phosphatase: 69 U/L (ref 38–126)
Anion gap: 12 (ref 5–15)
BUN: 16 mg/dL (ref 6–20)
CO2: 21 mmol/L — ABNORMAL LOW (ref 22–32)
Calcium: 8.8 mg/dL — ABNORMAL LOW (ref 8.9–10.3)
Chloride: 106 mmol/L (ref 98–111)
Creatinine, Ser: 0.81 mg/dL (ref 0.44–1.00)
GFR calc Af Amer: >60 mL/min (ref >60)
GFR calc non Af Amer: >60 mL/min (ref >60)
Glucose, Bld: 124 mg/dL — ABNORMAL HIGH (ref 70–99)
Potassium: 5.6 mmol/L — ABNORMAL HIGH (ref 3.5–5.1)
Sodium: 139 mmol/L (ref 135–145)
Total Bilirubin: 1.2 mg/dL (ref 0.3–1.2)
Total Protein: 7.4 g/dL (ref 6.5–8.1)

## 2019-12-14 LAB — URINALYSIS, COMPLETE (UACMP) WITH MICROSCOPIC
Bacteria, UA: NONE SEEN
Bilirubin Urine: NEGATIVE
Glucose, UA: NEGATIVE mg/dL
Ketones, ur: 5 mg/dL — AB
Leukocytes,Ua: NEGATIVE
Nitrite: NEGATIVE
Protein, ur: NEGATIVE mg/dL
RBC / HPF: >50 RBC/hpf — ABNORMAL HIGH (ref 0–5)
Specific Gravity, Urine: 1.014 (ref 1.005–1.030)
pH: 7 (ref 5.0–8.0)

## 2019-12-14 LAB — CBC WITH DIFFERENTIAL/PLATELET
Abs Immature Granulocytes: 0.1 10*3/uL — ABNORMAL HIGH (ref 0.00–0.07)
Basophils Absolute: 0 10*3/uL (ref 0.0–0.1)
Basophils Relative: 0 %
Eosinophils Absolute: 0 10*3/uL (ref 0.0–0.5)
Eosinophils Relative: 0 %
HCT: 39 % (ref 36.0–46.0)
Hemoglobin: 12.8 g/dL (ref 12.0–15.0)
Immature Granulocytes: 1 %
Lymphocytes Relative: 12 %
Lymphs Abs: 1.9 10*3/uL (ref 0.7–4.0)
MCH: 28.6 pg (ref 26.0–34.0)
MCHC: 32.8 g/dL (ref 30.0–36.0)
MCV: 87.2 fL (ref 80.0–100.0)
Monocytes Absolute: 0.7 10*3/uL (ref 0.1–1.0)
Monocytes Relative: 4 %
Neutro Abs: 13 10*3/uL — ABNORMAL HIGH (ref 1.7–7.7)
Neutrophils Relative %: 83 %
Platelets: 379 10*3/uL (ref 150–400)
RBC: 4.47 MIL/uL (ref 3.87–5.11)
RDW: 14.7 % (ref 11.5–15.5)
WBC: 15.7 10*3/uL — ABNORMAL HIGH (ref 4.0–10.5)
nRBC: 0 % (ref 0.0–0.2)

## 2019-12-14 LAB — RESPIRATORY PANEL BY RT PCR (FLU A&B, COVID)
Influenza A by PCR: NEGATIVE
Influenza B by PCR: NEGATIVE
SARS Coronavirus 2 by RT PCR: NEGATIVE

## 2019-12-14 LAB — HCG, QUANTITATIVE, PREGNANCY: hCG, Beta Chain, Quant, S: <1 m[IU]/mL (ref <5)

## 2019-12-14 SURGERY — CYSTOSCOPY/URETEROSCOPY/HOLMIUM LASER/STENT PLACEMENT
Anesthesia: General | Laterality: Right

## 2019-12-14 MED ORDER — FENTANYL CITRATE (PF) 100 MCG/2ML IJ SOLN
INTRAMUSCULAR | Status: AC
  Filled 2019-12-14: qty 2

## 2019-12-14 MED ORDER — ONDANSETRON HCL 4 MG/2ML IJ SOLN: 4.0000 mg | Freq: Once | INTRAMUSCULAR | Status: DC | PRN

## 2019-12-14 MED ORDER — ONDANSETRON HCL 4 MG/2ML IJ SOLN
4.0000 mg | Freq: Once | INTRAMUSCULAR | Status: AC
  Administered 2019-12-14: 4 mg via INTRAVENOUS
  Filled 2019-12-14: qty 2

## 2019-12-14 MED ORDER — OXYCODONE HCL 5 MG PO TABS: 5.0000 mg | ORAL_TABLET | Freq: Once | ORAL | Status: DC | PRN

## 2019-12-14 MED ORDER — FENTANYL CITRATE (PF) 100 MCG/2ML IJ SOLN: 25.0000 ug | INTRAMUSCULAR | Status: DC | PRN

## 2019-12-14 MED ORDER — CIPROFLOXACIN IN D5W 400 MG/200ML IV SOLN
400.0000 mg | Freq: Once | INTRAVENOUS | Status: AC
  Administered 2019-12-14: 400 mg via INTRAVENOUS

## 2019-12-14 MED ORDER — MIDAZOLAM HCL 2 MG/2ML IJ SOLN
INTRAMUSCULAR | Status: DC | PRN
  Administered 2019-12-14: 2 mg via INTRAVENOUS

## 2019-12-14 MED ORDER — HYDROMORPHONE HCL 1 MG/ML IJ SOLN
0.5000 mg | INTRAMUSCULAR | Status: AC
  Administered 2019-12-14: 0.5 mg via INTRAVENOUS
  Filled 2019-12-14: qty 1

## 2019-12-14 MED ORDER — ACETAMINOPHEN 10 MG/ML IV SOLN
INTRAVENOUS | Status: AC
  Filled 2019-12-14: qty 100

## 2019-12-14 MED ORDER — HYDROCODONE-ACETAMINOPHEN 5-325 MG PO TABS
1.0000 | ORAL_TABLET | Freq: Four times a day (QID) | ORAL | 0 refills | Status: AC | PRN
Start: 2019-12-14 — End: 2020-12-13

## 2019-12-14 MED ORDER — LIDOCAINE HCL (CARDIAC) PF 100 MG/5ML IV SOSY
PREFILLED_SYRINGE | INTRAVENOUS | Status: DC | PRN
  Administered 2019-12-14: 100 mg via INTRAVENOUS

## 2019-12-14 MED ORDER — LACTATED RINGERS IV BOLUS
1000.0000 mL | Freq: Once | INTRAVENOUS | Status: AC
  Administered 2019-12-14: 1000 mL via INTRAVENOUS

## 2019-12-14 MED ORDER — CIPROFLOXACIN HCL 500 MG PO TABS: 500.0000 mg | ORAL_TABLET | Freq: Every day | ORAL | 0 refills | Status: AC

## 2019-12-14 MED ORDER — TAMSULOSIN HCL 0.4 MG PO CAPS: 0.4000 mg | ORAL_CAPSULE | Freq: Every day | ORAL | 0 refills | Status: DC

## 2019-12-14 MED ORDER — SUGAMMADEX SODIUM 200 MG/2ML IV SOLN
INTRAVENOUS | Status: DC | PRN
  Administered 2019-12-14: 200 mg via INTRAVENOUS

## 2019-12-14 MED ORDER — CIPROFLOXACIN IN D5W 400 MG/200ML IV SOLN
INTRAVENOUS | Status: AC
  Filled 2019-12-14: qty 200

## 2019-12-14 MED ORDER — ACETAMINOPHEN 10 MG/ML IV SOLN: 1000.0000 mg | Freq: Once | INTRAVENOUS | Status: DC | PRN

## 2019-12-14 MED ORDER — PROPOFOL 10 MG/ML IV BOLUS
INTRAVENOUS | Status: AC
  Filled 2019-12-14: qty 20

## 2019-12-14 MED ORDER — HYDROMORPHONE HCL 1 MG/ML IJ SOLN
1.0000 mg | INTRAMUSCULAR | Status: AC
  Administered 2019-12-14: 1 mg via INTRAVENOUS
  Filled 2019-12-14: qty 1

## 2019-12-14 MED ORDER — ONDANSETRON HCL 4 MG/2ML IJ SOLN
INTRAMUSCULAR | Status: DC | PRN
  Administered 2019-12-14: 4 mg via INTRAVENOUS

## 2019-12-14 MED ORDER — MIDAZOLAM HCL 2 MG/2ML IJ SOLN
INTRAMUSCULAR | Status: AC
  Filled 2019-12-14: qty 2

## 2019-12-14 MED ORDER — FAMOTIDINE 20 MG PO TABS
ORAL_TABLET | ORAL | Status: AC
  Administered 2019-12-14: 20 mg
  Filled 2019-12-14: qty 1

## 2019-12-14 MED ORDER — BELLADONNA ALKALOIDS-OPIUM 16.2-60 MG RE SUPP
RECTAL | Status: AC
  Filled 2019-12-14: qty 1

## 2019-12-14 MED ORDER — IOHEXOL 180 MG/ML  SOLN
INTRAMUSCULAR | Status: DC | PRN
  Administered 2019-12-14: 15 mL

## 2019-12-14 MED ORDER — LACTATED RINGERS IV SOLN: INTRAVENOUS | Status: DC | PRN

## 2019-12-14 MED ORDER — OXYBUTYNIN CHLORIDE ER 10 MG PO TB24: 10.0000 mg | ORAL_TABLET | Freq: Every day | ORAL | 0 refills | Status: AC | PRN

## 2019-12-14 MED ORDER — KETOROLAC TROMETHAMINE 15 MG/ML IJ SOLN
INTRAMUSCULAR | Status: DC | PRN
  Administered 2019-12-14: 15 mg via INTRAVENOUS

## 2019-12-14 MED ORDER — PROPOFOL 10 MG/ML IV BOLUS
INTRAVENOUS | Status: DC | PRN
  Administered 2019-12-14: 200 mg via INTRAVENOUS

## 2019-12-14 MED ORDER — SODIUM CHLORIDE 0.9 % IV BOLUS
500.0000 mL | Freq: Once | INTRAVENOUS | Status: AC
  Administered 2019-12-14: 500 mL via INTRAVENOUS

## 2019-12-14 MED ORDER — MORPHINE SULFATE (PF) 4 MG/ML IV SOLN
4.0000 mg | Freq: Once | INTRAVENOUS | Status: AC
  Administered 2019-12-14: 4 mg via INTRAVENOUS
  Filled 2019-12-14: qty 1

## 2019-12-14 MED ORDER — DEXAMETHASONE SODIUM PHOSPHATE 10 MG/ML IJ SOLN
INTRAMUSCULAR | Status: DC | PRN
  Administered 2019-12-14: 10 mg via INTRAVENOUS

## 2019-12-14 MED ORDER — PHENYLEPHRINE HCL (PRESSORS) 10 MG/ML IV SOLN
INTRAVENOUS | Status: DC | PRN
  Administered 2019-12-14 (×2): 100 ug via INTRAVENOUS
  Administered 2019-12-14: 200 ug via INTRAVENOUS

## 2019-12-14 MED ORDER — FAMOTIDINE 20 MG PO TABS: 20.0000 mg | ORAL_TABLET | Freq: Once | ORAL | Status: DC

## 2019-12-14 MED ORDER — ROCURONIUM BROMIDE 100 MG/10ML IV SOLN
INTRAVENOUS | Status: DC | PRN
  Administered 2019-12-14: 70 mg via INTRAVENOUS

## 2019-12-14 MED ORDER — FENTANYL CITRATE (PF) 100 MCG/2ML IJ SOLN
INTRAMUSCULAR | Status: DC | PRN
Start: 2019-12-14 — End: 2019-12-14
  Administered 2019-12-14 (×2): 25 ug via INTRAVENOUS
  Administered 2019-12-14: 50 ug via INTRAVENOUS

## 2019-12-14 MED ORDER — ACETAMINOPHEN 10 MG/ML IV SOLN
INTRAVENOUS | Status: DC | PRN
  Administered 2019-12-14: 1000 mg via INTRAVENOUS

## 2019-12-14 MED ORDER — OXYCODONE HCL 5 MG/5ML PO SOLN: 5.0000 mg | Freq: Once | ORAL | Status: DC | PRN

## 2019-12-14 MED ORDER — BELLADONNA ALKALOIDS-OPIUM 16.2-60 MG RE SUPP
RECTAL | Status: DC | PRN
Start: 2019-12-14 — End: 2019-12-14
  Administered 2019-12-14: 1 via RECTAL

## 2019-12-14 SURGICAL SUPPLY — 30 items
BAG DRAIN CYSTO-URO LG1000N (MISCELLANEOUS) ×3 IMPLANT
BAG URINE DRAIN 2000ML AR STRL (UROLOGICAL SUPPLIES) ×3 IMPLANT
BRUSH SCRUB EZ 1% IODOPHOR (MISCELLANEOUS) IMPLANT
CATH FOL 2WAY LX 16X30 (CATHETERS) ×3 IMPLANT
CATH URET FLEX-TIP 2 LUMEN 10F (CATHETERS) ×3 IMPLANT
CATH URETL 5X70 OPEN END (CATHETERS) ×3 IMPLANT
CONRAY 43 FOR UROLOGY 50M (MISCELLANEOUS) ×3 IMPLANT
FIBER LASER TRAC TIP (UROLOGICAL SUPPLIES) ×3 IMPLANT
GLOVE BIOGEL PI IND STRL 7.5 (GLOVE) ×2 IMPLANT
GLOVE BIOGEL PI INDICATOR 7.5 (GLOVE) ×1
GOWN STRL REUS W/ TWL LRG LVL3 (GOWN DISPOSABLE) ×2 IMPLANT
GOWN STRL REUS W/ TWL XL LVL3 (GOWN DISPOSABLE) ×2 IMPLANT
GOWN STRL REUS W/TWL LRG LVL3 (GOWN DISPOSABLE) ×1
GOWN STRL REUS W/TWL XL LVL3 (GOWN DISPOSABLE) ×1
GUIDEWIRE STR DUAL SENSOR (WIRE) ×6 IMPLANT
HOLDER FOLEY CATH W/STRAP (MISCELLANEOUS) ×3 IMPLANT
KIT TURNOVER CYSTO (KITS) ×3 IMPLANT
PACK CYSTO AR (MISCELLANEOUS) ×3 IMPLANT
SET CYSTO W/LG BORE CLAMP LF (SET/KITS/TRAYS/PACK) ×3 IMPLANT
SOL .9 NS 3000ML IRR  AL (IV SOLUTION) ×1
SOL .9 NS 3000ML IRR UROMATIC (IV SOLUTION) ×2 IMPLANT
STENT URET 6FRX24 CONTOUR (STENTS) ×3 IMPLANT
STENT URET 6FRX26 CONTOUR (STENTS) IMPLANT
SURGILUBE 2OZ TUBE FLIPTOP (MISCELLANEOUS) ×3 IMPLANT
SYR 10ML LL (SYRINGE) ×3 IMPLANT
SYR TOOMEY IRRIG 70ML (MISCELLANEOUS)
SYRINGE TOOMEY IRRIG 70ML (MISCELLANEOUS) IMPLANT
Ureteral access sheath set ×3 IMPLANT
VALVE UROSEAL ADJ ENDO (VALVE) ×3 IMPLANT
WATER STERILE IRR 1000ML POUR (IV SOLUTION) ×3 IMPLANT

## 2019-12-14 NOTE — Op Note (Signed)
Date of procedure: 12/14/19  Preoperative diagnosis:  1. Right proximal ureteral stone 2. Right renal stone  Postoperative diagnosis:  1. Same  Procedure: 1. Cystoscopy, right retrograde pyelogram with intraoperative interpretation, right ureteral stent placement 2. Right ureteroscopy and laser lithotripsy of proximal ureteral stone 3. Right ureteroscopy and laser lithotripsy of upper pole renal stone  Surgeon: Legrand Rams, MD  Anesthesia: General  Complications: None  Intraoperative findings:  1.  Dusting of right proximal ureteral stone, and right renal stone 2.  Uncomplicated right ureteral stent placement  EBL: Minimal  Specimens: None  Drains: Right 6 French by 24 cm ureteral stent  Indication: Toni Salazar is a 34 y.o. patient who presented to the ED this morning with 12 hours of severe right renal colic and poorly controlled pain with nausea and vomiting.  Urinalysis was benign, and she opted for ureteroscopy, laser lithotripsy, and stent placement.  She had mild leukocytosis to 16K and we discussed possible need for stent placement and staged ureteroscopy if any evidence of intraoperative infection.  After reviewing the management options for treatment, they elected to proceed with the above surgical procedure(s). We have discussed the potential benefits and risks of the procedure, side effects of the proposed treatment, the likelihood of the patient achieving the goals of the procedure, and any potential problems that might occur during the procedure or recuperation. Informed consent has been obtained.  Description of procedure:  The patient was taken to the operating room and general anesthesia was induced. SCDs were placed for DVT prophylaxis. The patient was placed in the dorsal lithotomy position, prepped and draped in the usual sterile fashion, and preoperative antibiotics(Cipro) were administered. A preoperative time-out was performed.   21 French rigid  cystoscope was used to intubate the urethra and thorough cystoscopy was performed.  The bladder was grossly normal.  I started by performing a right retrograde pyelogram via a ureteral access catheter, and this showed no contrast passing alongside her proximal ureteral stone.  A sensor wire advanced easily alongside the wire up into the kidney, and there is no evidence of purulent drainage.  A dual-lumen access catheter was used to gently dilate the ureter, and had a second safety wire.  A 12/14 French by 35 cm ureteral access sheath was gently advanced over the wire under fluoroscopic vision up to the level of the proximal ureteral stone.  Identified a yellow stone in the proximal ureter, and there was no evidence of purulent or cloudy urine behind the stone.  A 200 m laser fiber in setting of 0.5 J and 40 Hz was used to methodically fragment the stone to dust.  I then was able to advance the ureteroscope up into the kidney and identified a larger 1 cm stone in the upper pole.  This was also fragmented to dust on previously mentioned settings.  The stone was quite hard.  Thorough pyeloscopy revealed no other residual fragments at the conclusion of the case.  Contrast was injected from the proximal ureter and showed no filling defects or extravasation.  Careful pullback ureteroscopy showed no ureteral injury or residual fragments.  Under fluoroscopic vision, a 6 [place] by 24 cm ureteral stent was uneventfully placed on the right side with a shepherd's hook in the upper pole, and a curl in the bladder.  A Foley catheter was placed to temporarily maximize drainage.  Disposition: Stable to PACU  Plan: Remove Foley prior to discharge Okay for discharge home today for mains afebrile and hemodynamically stable  Cipro prophylaxis while stent in place Stent removal in clinic in 10 to 14 days Consider metabolic work-up with her recurrent stones  Legrand Rams, MD

## 2019-12-14 NOTE — ED Notes (Signed)
This RN to bedside, explained awaiting security to come to ED, working on a plan to allow patient to go to her car to put her belongings away and get phone number from her car.

## 2019-12-14 NOTE — ED Provider Notes (Signed)
CT Renal Stone Study  Result Date: 12/14/2019 CLINICAL DATA:  Right flank pain.  Nausea and vomiting EXAM: CT ABDOMEN AND PELVIS WITHOUT CONTRAST TECHNIQUE: Multidetector CT imaging of the abdomen and pelvis was performed following the standard protocol without oral or IV contrast. COMPARISON:  December 15, 2018 FINDINGS: Lower chest: Lung bases are clear. Hepatobiliary: There is hepatic steatosis. No focal liver lesions are evident on this noncontrast enhanced study. Gallbladder wall is not appreciably thickened. There is no biliary duct dilatation. Pancreas: There is no pancreatic mass or inflammatory focus. Spleen: No splenic lesions are evident. Adrenals/Urinary Tract: Adrenals bilaterally appear normal. Right kidney is mildly edematous. There is no evident renal mass on either side. There is moderate hydronephrosis on the right. There is no hydronephrosis on the left. There is a calculus in the upper pole of the right kidney measuring 1.2 x 1.1 cm. There is a 3 x 3 mm calculus in the lower pole of the left kidney. There is a calculus in the proximal right ureter slightly beyond the right ureteropelvic junction measuring 8 x 5 mm. There is edema in the proximal most aspect of the right ureter near this calculus. No other ureteral calculi are evident on either side. Urinary bladder is midline with wall thickness within normal limits. Stomach/Bowel: There are sigmoid and descending colonic diverticula without evident diverticulitis. There is moderate stool in the colon. The terminal ileum appears unremarkable. There is no evident bowel obstruction. There is no free air or portal venous air. Vascular/Lymphatic: There is no abdominal aortic aneurysm. No vascular lesions are appreciable on this noncontrast enhanced study. There is no demonstrable adenopathy in the abdomen or pelvis. Reproductive: Uterus is retroverted. Uterus has a subtly lobular contour which may indicate a degree of underlying leiomyomatous change.  No adnexal masses are evident on noncontrast enhanced CT. Other: Appendix appears normal. No appreciable abscess or ascites is evident in the abdomen or pelvis. Musculoskeletal: There are no blastic or lytic bone lesions. 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 No intramuscular or abdominal wall lesions evident. IMPRESSION: 1. 8 x 5 mm calculus in the proximal right ureter causing moderate hydronephrosis on the right. Right kidney and proximal right ureter show evidence of edema. No frank perinephric fluid noted. 2. Nonobstructing calculus in the upper pole of the right kidney measuring 1.2 x 1.1 cm. There is a 3 x 3 mm nonobstructing calculus in the lower pole left kidney. 3. Sigmoid and descending colonic diverticulosis without diverticulitis. No bowel wall thickening or bowel obstruction. No abscess in the abdomen or pelvis. Appendix appears normal. 4.  Suspect leiomyomatous change within the uterus. 5.  Hepatic steatosis. Electronically Signed   By: Bretta Bang III M.D.   On: 12/14/2019 08:28      ----------------------------------------- 8:41 AM on 12/14/2019 -----------------------------------------  Urology paged for consultation.  Patient pain returning, moderate to increasingly severe again located in the right flank.  Give additional hydromorphone.  I do have concern regarding the size of her stone as well as her associated leukocytosis that this stone may not pass and that pain control may be a significant issue, also need to exclude infection.  However, await urinalysis, and urology consult   Sharyn Creamer, MD 12/14/19 [phone]

## 2019-12-14 NOTE — Anesthesia Procedure Notes (Signed)
Procedure Name: Intubation Date/Time: 12/14/2019 11:39 AM Performed by: Debe Coder, CRNA Pre-anesthesia Checklist: Patient identified, Emergency Drugs available, Suction available and Patient being monitored Patient Re-evaluated:Patient Re-evaluated prior to induction Oxygen Delivery Method: Circle system utilized Preoxygenation: Pre-oxygenation with 100% oxygen Induction Type: IV induction Ventilation: Mask ventilation without difficulty Laryngoscope Size: Mac and 3 Grade View: Grade II Tube type: Oral Tube size: 7.0 mm Number of attempts: 1 Airway Equipment and Method: Stylet and Oral airway Placement Confirmation: ETT inserted through vocal cords under direct vision,  positive ETCO2 and breath sounds checked- equal and bilateral Secured at: 20 cm Tube secured with: Tape Dental Injury: Teeth and Oropharynx as per pre-operative assessment

## 2019-12-14 NOTE — ED Provider Notes (Signed)
[facility] Emergency Department Provider Note   ____________________________________________   First MD Initiated Contact with Patient 12/14/19 [phone]     (approximate)  I have reviewed the triage vital signs and the nursing notes.   HISTORY  Chief Complaint Flank Pain    HPI Toni Salazar is a 34 y.o. female with past medical history of kidney stones who presents to the ED complaining of flank pain.  Patient states that she was woken from sleep with severe pain in her right flank that radiates around into the right lower quadrant of her abdomen.  Pain has been constant since onset, waxing and waning in severity but not exacerbated or alleviated by anything in particular.  It is been associated with nausea with three episodes of vomiting, but she denies any diarrhea.  She has not had any recent fevers and denies any dysuria or hematuria.  She describes current symptoms as similar to when she previously dealt with a kidney stone.        Past Medical History:  Diagnosis Date  . History of kidney stones     Patient Active Problem List   Diagnosis Date Noted  . Right ureteral stone 04/29/2017    Past Surgical History:  Procedure Laterality Date  . CESAREAN SECTION    . CYSTOSCOPY WITH STENT PLACEMENT Right 04/29/2017   Procedure: CYSTOSCOPY WITH STENT PLACEMENT;  Surgeon: Vanna Scotland, MD;  Location: [facility] ORS;  Service: Urology;  Laterality: Right;  . CYSTOSCOPY WITH URETEROSCOPY Right 05/07/2017   Procedure: CYSTOSCOPY WITH URETEROSCOPY;  Surgeon: Vanna Scotland, MD;  Location: [facility] ORS;  Service: Urology;  Laterality: Right;  . STENT REMOVAL  05/07/2017   Procedure: STENT REMOVAL;  Surgeon: Vanna Scotland, MD;  Location: [facility] ORS;  Service: Urology;;    Prior to Admission medications   Medication Sig Start Date End Date Taking? Authorizing Provider  Ibuprofen-Diphenhydramine HCl (ADVIL PM) 200-25 MG CAPS Take 2 tablets by mouth at bedtime.      [provider]  oxyCODONE-acetaminophen (PERCOCET) 5-325 MG tablet Take 1 tablet by mouth every 4 (four) hours as needed for severe pain. 12/15/18 12/15/19  Fisher, Roselyn Bering, PA-C  promethazine (PHENERGAN) 25 MG suppository Place 1 suppository (25 mg total) rectally every 6 (six) hours as needed for nausea. 12/15/18 12/15/19  Faythe Ghee, PA-C    Allergies Amoxicillin and Sulfa antibiotics  No family history on file.  Social History Social History   Tobacco Use  . Smoking status: Current Every Day Smoker    Types: Cigarettes  . Smokeless tobacco: Never Used  Vaping Use  . Vaping Use: Never used  Substance Use Topics  . Alcohol use: Yes  . Drug use: No    Review of Systems  Constitutional: No fever/chills Eyes: No visual changes. ENT: No sore throat. Cardiovascular: Denies chest pain. Respiratory: Denies shortness of breath. Gastrointestinal: Positive for flank and abdominal pain.  Positive for nausea and vomiting.  No diarrhea.  No constipation. Genitourinary: Negative for dysuria. Musculoskeletal: Negative for back pain. Skin: Negative for rash. Neurological: Negative for headaches, focal weakness or numbness.  ____________________________________________   PHYSICAL EXAM:  VITAL SIGNS: ED Triage Vitals  Enc Vitals Group     BP 12/14/19 0625 (!) 130/100     Pulse Rate 12/14/19 0625 (!) 106     Resp 12/14/19 0625 (!) 24     Temp 12/14/19 0625 (!) 97.4 F (36.3 C)     Temp Source 12/14/19 0625 Oral  SpO2 12/14/19 0625 98 %     Weight 12/14/19 0624 210 lb (95.3 kg)     Height 12/14/19 0624 5\' 3"  (1.6 m)     Head Circumference --      Peak Flow --      Pain Score 12/14/19 0624 8     Pain Loc --      Pain Edu? --      Excl. in GC? --     Constitutional: Alert and oriented.  Uncomfortable appearing. Eyes: Conjunctivae are normal. Head: Atraumatic. Nose: No congestion/rhinnorhea. Mouth/Throat: Mucous membranes are moist. Neck: Normal  ROM Cardiovascular: Normal rate, regular rhythm. Grossly normal heart sounds. Respiratory: Normal respiratory effort.  No retractions. Lungs CTAB. Gastrointestinal: Soft and nontender. No distention.  No CVA tenderness bilaterally. Genitourinary: deferred Musculoskeletal: No lower extremity tenderness nor edema. Neurologic:  Normal speech and language. No gross focal neurologic deficits are appreciated. Skin:  Skin is warm, dry and intact. No rash noted. Psychiatric: Mood and affect are normal. Speech and behavior are normal.  ____________________________________________   LABS (all labs ordered are listed, but only abnormal results are displayed)  Labs Reviewed  CBC WITH DIFFERENTIAL/PLATELET  COMPREHENSIVE METABOLIC PANEL  URINALYSIS, COMPLETE (UACMP) WITH MICROSCOPIC    PROCEDURES  Procedure(s) performed (including Critical Care):  Procedures   ____________________________________________   INITIAL IMPRESSION / ASSESSMENT AND PLAN / ED COURSE       34 year old female with past medical history of kidney stones who presents to the ED complaining of sudden onset flank pain radiating into her right groin since 1 o'clock this morning.  Symptoms are typical of her previous kidney stone and we will further assess with CT renal study.  Labs and UA are pending, for now we will hydrate patient with IV fluids, treat symptomatically with IV morphine and Zofran.  Patient turned over to oncoming provider pending additional results and reassessment.      ____________________________________________   FINAL CLINICAL IMPRESSION(S) / ED DIAGNOSES  Final diagnoses:  Flank pain     ED Discharge Orders    None       Note:  This document was prepared using Dragon voice recognition software and may include unintentional dictation errors.   33, MD 12/14/19 [phone]

## 2019-12-14 NOTE — ED Notes (Signed)
This RN accompanied patient to car to get phone number to arrange child care. This RN explained valuable policy to patient. Pt given gown and socks to change into at this time.

## 2019-12-14 NOTE — ED Notes (Signed)
This RN to bedside, introduced self to patient. Pt states she thinks she can provide urine sample, hat placed in toilet for pt to attempt UA. Pt also c/o returning pain at this time. EDP notified.

## 2019-12-14 NOTE — ED Provider Notes (Signed)
Patient doubled over in severe pain, somewhat in the reverse lithotomy position.  She appears in severe pain.  Denies pregnancy.  Will provide hydromorphone 1 mg.  Monitor on pulse oximetry, she is fully awake and alert reporting severe pain   Sharyn Creamer, MD 12/14/19 [phone]

## 2019-12-14 NOTE — ED Notes (Signed)
Dr. Fanny Bien to bedside at this time. Pain medicine given per order.

## 2019-12-14 NOTE — Transfer of Care (Signed)
Immediate Anesthesia Transfer of Care Note  Patient: Toni Salazar  Procedure(s) Performed: CYSTOSCOPY/URETEROSCOPY/HOLMIUM LASER/STENT PLACEMENT (Right ) CYSTOSCOPY WITH RETROGRADE PYELOGRAM  Patient Location: PACU  Anesthesia Type:General  Level of Consciousness: drowsy and patient cooperative  Airway & Oxygen Therapy: Patient Spontanous Breathing and Patient connected to face mask oxygen  Post-op Assessment: Report given to RN and Post -op Vital signs reviewed and stable  Post vital signs: Reviewed and stable  Last Vitals:  Vitals Value Taken Time  BP 133/72 12/14/19 1241  Temp 36.7 C 12/14/19 1241  Pulse 81 12/14/19 1245  Resp 18 12/14/19 1245  SpO2 99 % 12/14/19 1245    Last Pain:  Vitals:   12/14/19 1241  TempSrc:   PainSc: Asleep         Complications: No complications documented.

## 2019-12-14 NOTE — ED Notes (Signed)
This RN to bedside at this time. Introduced self to pt. Pt visualized to be sitting on knees in bed crying. Pt has moans audible from outside the room at this time. Pt states pain is "worse than labor" at this time and the "morphine didn't touch the pain"  Dr. Fanny Bien notified at this time.

## 2019-12-14 NOTE — Anesthesia Postprocedure Evaluation (Signed)
Anesthesia Post Note  Patient: Toni Salazar  Procedure(s) Performed: CYSTOSCOPY/URETEROSCOPY/HOLMIUM LASER/STENT PLACEMENT (Right ) CYSTOSCOPY WITH RETROGRADE PYELOGRAM  Patient location during evaluation: PACU Anesthesia Type: General Level of consciousness: awake and alert Pain management: pain level controlled Vital Signs Assessment: post-procedure vital signs reviewed and stable Respiratory status: spontaneous breathing, nonlabored ventilation, respiratory function stable and patient connected to nasal cannula oxygen Cardiovascular status: blood pressure returned to baseline and stable Postop Assessment: no apparent nausea or vomiting Anesthetic complications: no   No complications documented.   Last Vitals:  Vitals:   12/14/19 1340 12/14/19 1349  BP: (!) 146/93 (!) 137/95  Pulse: 62 78  Resp: 16 18  Temp:  (!) 35.9 C  SpO2: 95% (!) 78%    Last Pain:  Vitals:   12/14/19 1349  TempSrc: Temporal  PainSc: 1                  Corinda Gubler

## 2019-12-14 NOTE — ED Notes (Signed)
This RN to bedside at this time due to pump alarming. Pt requesting to go outside to her car to put her personal belongings in the car and get a phone number from her car. This RN explained would have to consult with charge RN due to patient having IV and being scheduled for surgery. Pt states, "I was wondering if we could just temporarily remove it then put it back while I go outside". This RN explained would consult with charge and return to room.

## 2019-12-14 NOTE — ED Triage Notes (Signed)
Pt to triage via w/c, appears very uncomfortable; st awoke with rt flank pain radiating around into abd; st hx of kidney stones

## 2019-12-14 NOTE — ED Notes (Signed)
Report given to [facility], Same Day Surgery at this time

## 2019-12-14 NOTE — ED Provider Notes (Signed)
Case discussed with Dr. Richardo Hanks, advises urology PA will be to see the patient shortly, anticipates likely need for admission for possible renal stenting   Sharyn Creamer, MD 12/14/19 [phone]

## 2019-12-14 NOTE — Anesthesia Preprocedure Evaluation (Signed)
Anesthesia Evaluation  Patient identified by MRN, date of birth, ID band Patient awake    Reviewed: Allergy & Precautions, NPO status , Patient's Chart, lab work & pertinent test results  History of Anesthesia Complications Negative for: history of anesthetic complications  Airway Mallampati: III  TM Distance: >3 FB Neck ROM: Full    Dental no notable dental hx. (+) Teeth Intact, Dental Advisory Given   Pulmonary neg sleep apnea, neg COPD, Current Smoker and Patient abstained from smoking.,    Pulmonary exam normal breath sounds clear to auscultation       Cardiovascular Exercise Tolerance: Good METS(-) hypertension(-) CAD, (-) Past MI and (-) CHF negative cardio ROS  (-) dysrhythmias (-) Valvular Problems/Murmurs Rhythm:Regular Rate:Normal - Systolic murmurs    Neuro/Psych neg Seizures negative neurological ROS  negative psych ROS   GI/Hepatic Neg liver ROS, neg GERD  ,  Endo/Other  neg diabetes  Renal/GU Renal disease     Musculoskeletal   Abdominal (+) + obese,   Peds  Hematology   Anesthesia Other Findings Past Medical History: No date: History of kidney stones  Reproductive/Obstetrics                             Anesthesia Physical  Anesthesia Plan  ASA: II and emergent  Anesthesia Plan: General   Post-op Pain Management:    Induction: Intravenous and Rapid sequence  PONV Risk Score and Plan: 4 or greater and Dexamethasone, Ondansetron and Midazolam  Airway Management Planned: Oral ETT  Additional Equipment: None  Intra-op Plan:   Post-operative Plan: Extubation in OR  Informed Consent: I have reviewed the patients History and Physical, chart, labs and discussed the procedure including the risks, benefits and alternatives for the proposed anesthesia with the patient or authorized representative who has indicated his/her understanding and acceptance.     Dental  advisory given  Plan Discussed with: CRNA and Surgeon  Anesthesia Plan Comments: (Patient currently nauseated, has been vomiting.   Patient's potassium this morning elevated at 5.6. Patient did note that the nurse had a very difficult time drawing blood and that the vials had been sitting for a while before being sent to lab. Patient is otherwise young with no major medical issues or intrinsic renal disease, likely hemolysis. Spoke with Dr Richardo Hanks regarding elevated potassium and he said this case was urgent due to concern for ascending renal infection, and that waiting for K+ treatment would not be feasible. Will not repeat potassium as will not change management, will avoid succinylcholine.  Discussed risks of anesthesia with patient, including PONV, aspiration, sore throat, lip/dental damage. Rare risks discussed as well, such as cardiorespiratory and neurological sequelae. Patient understands.)        Anesthesia Quick Evaluation

## 2019-12-14 NOTE — Consult Note (Signed)
Urology Consult  I have been asked to see the patient by Dr. Fanny Bien, for evaluation and management of renal colic with an 25mm proximal right ureteral stone and leukocytosis.  Chief Complaint: Right flank pain, nausea, vomiting  History of Present Illness: Toni Salazar is a 34 y.o. year old female with PMH nephrolithiasis including an infected right UVJ stone managed with staged ureteroscopy by Dr. Apolinar Junes in 2019 who presented to the ED this morning with an 8-hour history of severe right flank pain, nausea, and vomiting consistent with past stone episodes.  CT stone study notable for an 8 x 5 mm obstructing proximal right ureteral stone with upstream hydronephrosis as well as a 1.1 cm nonobstructing right upper pole renal stone and a 3 mm nonobstructing left lower pole renal stone.  She is afebrile, VSS. Admission labs notable for leukocytosis of 15.7 with left shift; creatinine 0.81 (baseline 0-5-0.7); UA with >50 RBCs/hpf, 0-5 WBCs/hpf, no nitrites, no bacteria. She is COVID negative. On antibiotics as below with urine culture pending.  Today she reports new or worsening right flank pain overnight.  She states she is currently menstruating, so she is not sure if she had misconstrued renal colic for menstrual cramps.  Last food or fluid intake yesterday around 1700.  Notably, she reports concern about her uninsured status and any associated medical bills, as she is a single parent with a special needs son.  She is concerned about her ability to arrange backup childcare during any impending hospitalization.  Anti-infectives (From admission, onward)   Start     Dose/Rate Route Frequency Ordered Stop   12/14/19 1000  ciprofloxacin (CIPRO) IVPB 400 mg        400 mg 200 mL/hr over 60 Minutes Intravenous  Once 12/14/19 0093        Past Medical History:  Diagnosis Date  . History of kidney stones     Past Surgical History:  Procedure Laterality Date  . CESAREAN SECTION    .  CYSTOSCOPY WITH STENT PLACEMENT Right 04/29/2017   Procedure: CYSTOSCOPY WITH STENT PLACEMENT;  Surgeon: Vanna Scotland, MD;  Location: [facility];  Service: Urology;  Laterality: Right;  . CYSTOSCOPY WITH URETEROSCOPY Right 05/07/2017   Procedure: CYSTOSCOPY WITH URETEROSCOPY;  Surgeon: Vanna Scotland, MD;  Location: [facility];  Service: Urology;  Laterality: Right;  . STENT REMOVAL  05/07/2017   Procedure: STENT REMOVAL;  Surgeon: Vanna Scotland, MD;  Location: [facility];  Service: Urology;;    Home Medications:  No outpatient medications have been marked as taking for the 12/14/19 encounter [facility] Encounter).    Allergies:  Allergies  Allergen Reactions  . Amoxicillin Hives and Swelling    Has patient had a PCN reaction causing immediate rash, facial/tongue/throat swelling, SOB or lightheadedness with hypotension: No Has patient had a PCN reaction causing severe rash involving mucus membranes or skin necrosis: No Has patient had a PCN reaction that required hospitalization: No Has patient had a PCN reaction occurring within the last 10 years: Yes If all of the above answers are "NO", then may proceed with Cephalosporin use.   . Sulfa Antibiotics Hives    No family history on file.  Social History:  reports that she has been smoking cigarettes. She has never used smokeless tobacco. She reports current alcohol use. She reports that she does not use drugs.  ROS: A complete review of systems was performed.  All systems are negative except for pertinent findings as noted.  Physical Exam:  Vital signs in last 24 hours: Temp:  [97.4 F (36.3 C)] 97.4 F (36.3 C) (09/30 0625) Pulse Rate:  [106] 106 (09/30 0625) Resp:  [24] 24 (09/30 0625) BP: (130)/(100) 130/100 (09/30 0625) SpO2:  [98 %] 98 % (09/30 0710) Weight:  [95.3 kg] 95.3 kg (09/30 0624) Constitutional:  Alert and oriented, uncomfortable appearing, tremulous HEENT: [place] AT, moist mucus membranes.  Trachea midline, no  masses Cardiovascular: Regular rate and rhythm, no clubbing, cyanosis, or edema. Respiratory: Normal respiratory effort, lungs clear bilaterally Skin: No rashes, bruises or suspicious lesions Neurologic: Grossly intact, no focal deficits, moving all 4 extremities Psychiatric: Normal mood and tearful affect  Laboratory Data:  Recent Labs    12/14/19 0632  WBC 15.7*  HGB 12.8  HCT 39.0   Recent Labs    12/14/19 0632  NA 139  K 5.6*  CL 106  CO2 21*  GLUCOSE 124*  BUN 16  CREATININE 0.81  CALCIUM 8.8*   Urinalysis    Component Value Date/Time   COLORURINE YELLOW (A) 12/15/2018 1747   APPEARANCEUR CLOUDY (A) 12/15/2018 1747   APPEARANCEUR Cloudy (A) 05/03/2017 1505   LABSPEC 1.021 12/15/2018 1747   LABSPEC 1.004 03/24/2011 2021   PHURINE 6.0 12/15/2018 1747   GLUCOSEU NEGATIVE 12/15/2018 1747   GLUCOSEU Negative 03/24/2011 2021   HGBUR LARGE (A) 12/15/2018 1747   BILIRUBINUR NEGATIVE 12/15/2018 1747   BILIRUBINUR Negative 05/03/2017 1505   BILIRUBINUR Negative 03/24/2011 2021   KETONESUR NEGATIVE 12/15/2018 1747   PROTEINUR 30 (A) 12/15/2018 1747   NITRITE NEGATIVE 12/15/2018 1747   LEUKOCYTESUR TRACE (A) 12/15/2018 1747   LEUKOCYTESUR Negative 03/24/2011 2021   Results for orders placed or performed in visit on 05/03/17  Microscopic Examination     Status: Abnormal   Collection Time: 05/03/17  3:05 PM   URINE  Result Value Ref Range Status   WBC, UA 0-5 0 - 5 /hpf Final   RBC, UA >30 (H) 0 - 2 /hpf Final   Epithelial Cells (non renal) 0-10 0 - 10 /hpf Final   Mucus, UA Present (A) Not Estab. Final   Bacteria, UA Few (A) None seen/Few Final  CULTURE, URINE COMPREHENSIVE     Status: None   Collection Time: 05/03/17  3:33 PM   Specimen: Urine   UI  Result Value Ref Range Status   Urine Culture, Comprehensive Final report  Final   Organism ID, Bacteria Comment  Final    Comment: No growth in 36 - 48 hours.    Radiologic Imaging: CT Renal Stone  Study  Result Date: 12/14/2019 CLINICAL DATA:  Right flank pain.  Nausea and vomiting EXAM: CT ABDOMEN AND PELVIS WITHOUT CONTRAST TECHNIQUE: Multidetector CT imaging of the abdomen and pelvis was performed following the standard protocol without oral or IV contrast. COMPARISON:  December 15, 2018 FINDINGS: Lower chest: Lung bases are clear. Hepatobiliary: There is hepatic steatosis. No focal liver lesions are evident on this noncontrast enhanced study. Gallbladder wall is not appreciably thickened. There is no biliary duct dilatation. Pancreas: There is no pancreatic mass or inflammatory focus. Spleen: No splenic lesions are evident. Adrenals/Urinary Tract: Adrenals bilaterally appear normal. Right kidney is mildly edematous. There is no evident renal mass on either side. There is moderate hydronephrosis on the right. There is no hydronephrosis on the left. There is a calculus in the upper pole of the right kidney measuring 1.2 x 1.1 cm. There is a 3 x 3 mm calculus in the lower pole of  the left kidney. There is a calculus in the proximal right ureter slightly beyond the right ureteropelvic junction measuring 8 x 5 mm. There is edema in the proximal most aspect of the right ureter near this calculus. No other ureteral calculi are evident on either side. Urinary bladder is midline with wall thickness within normal limits. Stomach/Bowel: There are sigmoid and descending colonic diverticula without evident diverticulitis. There is moderate stool in the colon. The terminal ileum appears unremarkable. There is no evident bowel obstruction. There is no free air or portal venous air. Vascular/Lymphatic: There is no abdominal aortic aneurysm. No vascular lesions are appreciable on this noncontrast enhanced study. There is no demonstrable adenopathy in the abdomen or pelvis. Reproductive: Uterus is retroverted. Uterus has a subtly lobular contour which may indicate a degree of underlying leiomyomatous change. No adnexal  masses are evident on noncontrast enhanced CT. Other: Appendix appears normal. No appreciable abscess or ascites is evident in the abdomen or pelvis. Musculoskeletal: There are no blastic or lytic bone lesions. 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 No intramuscular or abdominal wall lesions evident. IMPRESSION: 1. 8 x 5 mm calculus in the proximal right ureter causing moderate hydronephrosis on the right. Right kidney and proximal right ureter show evidence of edema. No frank perinephric fluid noted. 2. Nonobstructing calculus in the upper pole of the right kidney measuring 1.2 x 1.1 cm. There is a 3 x 3 mm nonobstructing calculus in the lower pole left kidney. 3. Sigmoid and descending colonic diverticulosis without diverticulitis. No bowel wall thickening or bowel obstruction. No abscess in the abdomen or pelvis. Appendix appears normal. 4.  Suspect leiomyomatous change within the uterus. 5.  Hepatic steatosis. Electronically Signed   By: Bretta Bang III M.D.   On: 12/14/2019 08:28   Assessment & Plan:  34 year old female with a history of nephrolithiasis requiring staged ureteroscopy presents with renal colic in the setting of an 8 mm proximal right ureteral stone.  She is leukocytotic, however UA is benign today.  Due to concerns for possible infected kidney stone causing her leukocytosis, we recommend urgent intervention today in the form of likely cystoscopy and right ureteral stent placement with Dr. Richardo Hanks with plans for staged ureteroscopy in the future for definitive stone management.  If no evidence of infection on ureteroscopy, Dr. Richardo Hanks may opt for definitive stone management as an alternative.  I discussed the risks and benefits of the procedure with the patient today, including infection, bleeding, and damage to surrounding structures.  I explained that if she is found to have infection associated with her stone and she elects not to treat it, she will  be at risk for worsening infection, which will be life-threatening.  Patient is in agreement with intervention today.  Right side marked.  Patient may be discharged this afternoon pending stability; otherwise instructed to arrange backup childcare.  Recommendations: -N.p.o. -Empiric antibiotics and pain control, hold NSAIDs -Cystoscopy and right ureteral stent placement versus ureteroscopy with laser lithotripsy and stent placement per intraoperative findings with Dr. Richardo Hanks today  Thank you for involving me in this patient's care, I will continue to follow along.  Carman Ching, PA-C 12/14/2019 9:54 AM

## 2019-12-15 LAB — URINE CULTURE

## 2020-01-03 ENCOUNTER — Other Ambulatory Visit: Payer: Self-pay

## 2020-01-03 ENCOUNTER — Encounter: Payer: Self-pay | Admitting: Urology

## 2020-01-03 ENCOUNTER — Ambulatory Visit (INDEPENDENT_AMBULATORY_CARE_PROVIDER_SITE_OTHER): Payer: Self-pay | Admitting: Urology

## 2020-01-03 VITALS — BP 168/102 | HR 99 | Ht 62.0 in | Wt 211.0 lb

## 2020-01-03 DIAGNOSIS — Z466 Encounter for fitting and adjustment of urinary device: Secondary | ICD-10-CM

## 2020-01-03 DIAGNOSIS — N201 Calculus of ureter: Secondary | ICD-10-CM

## 2020-01-03 MED ORDER — CIPROFLOXACIN HCL 500 MG PO TABS
500.0000 mg | ORAL_TABLET | Freq: Once | ORAL | Status: AC
  Administered 2020-01-03: 500 mg via ORAL

## 2020-01-03 MED ORDER — LIDOCAINE HCL URETHRAL/MUCOSAL 2 % EX GEL
1.0000 "application " | Freq: Once | CUTANEOUS | Status: AC
  Administered 2020-01-03: 1 via URETHRAL

## 2020-01-03 NOTE — Patient Instructions (Signed)
Dietary Guidelines to Help Prevent Kidney Stones Kidney stones are deposits of minerals and salts that form inside your kidneys. Your risk of developing kidney stones may be greater depending on your diet, your lifestyle, the medicines you take, and whether you have certain medical conditions. Most people can reduce their chances of developing kidney stones by following the instructions below. Depending on your overall health and the type of kidney stones you tend to develop, your dietitian may give you more specific instructions. What are tips for following this plan? Reading food labels  Choose foods with "no salt added" or "low-salt" labels. Limit your sodium intake to less than 1500 mg per day.  Choose foods with calcium for each meal and snack. Try to eat about 300 mg of calcium at each meal. Foods that contain 200-500 mg of calcium per serving include: ? 8 oz (237 ml) of milk, fortified nondairy milk, and fortified fruit juice. ? 8 oz (237 ml) of kefir, yogurt, and soy yogurt. ? 4 oz (118 ml) of tofu. ? 1 oz of cheese. ? 1 cup (300 g) of dried figs. ? 1 cup (91 g) of cooked broccoli. ? 1-3 oz can of sardines or mackerel.  Most people need 1000 to 1500 mg of calcium each day. Talk to your dietitian about how much calcium is recommended for you. Shopping  Buy plenty of fresh fruits and vegetables. Most people do not need to avoid fruits and vegetables, even if they contain nutrients that may contribute to kidney stones.  When shopping for convenience foods, choose: ? Whole pieces of fruit. ? Premade salads with dressing on the side. ? Low-fat fruit and yogurt smoothies.  Avoid buying frozen meals or prepared deli foods.  Look for foods with live cultures, such as yogurt and kefir. Cooking  Do not add salt to food when cooking. Place a salt shaker on the table and allow each person to add his or her own salt to taste.  Use vegetable protein, such as beans, textured vegetable  protein (TVP), or tofu instead of meat in pasta, casseroles, and soups. Meal planning   Eat less salt, if told by your dietitian. To do this: ? Avoid eating processed or premade food. ? Avoid eating fast food.  Eat less animal protein, including cheese, meat, poultry, or fish, if told by your dietitian. To do this: ? Limit the number of times you have meat, poultry, fish, or cheese each week. Eat a diet free of meat at least 2 days a week. ? Eat only one serving each day of meat, poultry, fish, or seafood. ? When you prepare animal protein, cut pieces into small portion sizes. For most meat and fish, one serving is about the size of one deck of cards.  Eat at least 5 servings of fresh fruits and vegetables each day. To do this: ? Keep fruits and vegetables on hand for snacks. ? Eat 1 piece of fruit or a handful of berries with breakfast. ? Have a salad and fruit at lunch. ? Have two kinds of vegetables at dinner.  Limit foods that are high in a substance called oxalate. These include: ? Spinach. ? Rhubarb. ? Beets. ? Potato chips and french fries. ? Nuts.  If you regularly take a diuretic medicine, make sure to eat at least 1-2 fruits or vegetables high in potassium each day. These include: ? Avocado. ? Banana. ? Orange, prune, carrot, or tomato juice. ? Baked potato. ? Cabbage. ? Beans and split   peas. General instructions   Drink enough fluid to keep your urine clear or pale yellow. This is the most important thing you can do.  Talk to your health care provider and dietitian about taking daily supplements. Depending on your health and the cause of your kidney stones, you may be advised: ? Not to take supplements with vitamin C. ? To take a calcium supplement. ? To take a daily probiotic supplement. ? To take other supplements such as magnesium, fish oil, or vitamin B6.  Take all medicines and supplements as told by your health care provider.  Limit alcohol intake to no  more than 1 drink a day for nonpregnant women and 2 drinks a day for men. One drink equals 12 oz of beer, 5 oz of wine, or 1 oz of hard liquor.  Lose weight if told by your health care provider. Work with your dietitian to find strategies and an eating plan that works best for you. What foods are not recommended? Limit your intake of the following foods, or as told by your dietitian. Talk to your dietitian about specific foods you should avoid based on the type of kidney stones and your overall health. Grains Breads. Bagels. Rolls. Baked goods. Salted crackers. Cereal. Pasta. Vegetables Spinach. Rhubarb. Beets. Canned vegetables. Pickles. Olives. Meats and other protein foods Nuts. Nut butters. Large portions of meat, poultry, or fish. Salted or cured meats. Deli meats. Hot dogs. Sausages. Dairy Cheese. Beverages Regular soft drinks. Regular vegetable juice. Seasonings and other foods Seasoning blends with salt. Salad dressings. Canned soups. Soy sauce. Ketchup. Barbecue sauce. Canned pasta sauce. Casseroles. Pizza. Lasagna. Frozen meals. Potato chips. French fries. Summary  You can reduce your risk of kidney stones by making changes to your diet.  The most important thing you can do is drink enough fluid. You should drink enough fluid to keep your urine clear or pale yellow.  Ask your health care provider or dietitian how much protein from animal sources you should eat each day, and also how much salt and calcium you should have each day. This information is not intended to replace advice given to you by your health care provider. Make sure you discuss any questions you have with your health care provider. Document Revised: 06/22/2018 Document Reviewed: 02/11/2016 Elsevier Patient Education  2020 [employer]  

## 2020-01-03 NOTE — Progress Notes (Signed)
Cystoscopy Procedure Note:  Indication: Stent removal s/p 12/14/2019 right ureteroscopy for 8 mm proximal stone and 1 cm renal pelvis stone  Cipro given prior to stent removal  After informed consent and discussion of the procedure and its risks, Toni Salazar was positioned and prepped in the standard fashion. Cystoscopy was performed with a flexible cystoscope. The stent was grasped with flexible graspers and removed in its entirety. The patient tolerated the procedure well.  Findings: Uncomplicated stent removal  Assessment and Plan: I recommended 4 to 6-week renal ultrasound to evaluate for silent hydronephrosis, as well as 24-hour urine for metabolic work-up.  She does not have insurance and would like to follow-up as needed at this time.  We discussed return precautions at length.  Sondra Come, MD 01/03/2020

## 2020-01-04 LAB — URINALYSIS, COMPLETE
Bilirubin, UA: NEGATIVE
Glucose, UA: NEGATIVE
Ketones, UA: NEGATIVE
Nitrite, UA: NEGATIVE
Specific Gravity, UA: >1.03 — ABNORMAL HIGH (ref 1.005–1.030)
Urobilinogen, Ur: 0.2 mg/dL (ref 0.2–1.0)
pH, UA: 6 (ref 5.0–7.5)

## 2020-01-04 LAB — MICROSCOPIC EXAMINATION: RBC, Urine: >30 /hpf — AB (ref 0–2)

## 2020-05-25 ENCOUNTER — Other Ambulatory Visit: Payer: Self-pay

## 2020-05-25 ENCOUNTER — Emergency Department
Admission: EM | Admit: 2020-05-25 | Discharge: 2020-05-25 | Disposition: A | Discharge status: Home / Self Care | Payer: Medicaid Other | Attending: Emergency Medicine | Admitting: Emergency Medicine

## 2020-05-25 ENCOUNTER — Emergency Department: Payer: Medicaid Other

## 2020-05-25 DIAGNOSIS — F17210 Nicotine dependence, cigarettes, uncomplicated: Secondary | ICD-10-CM | POA: Insufficient documentation

## 2020-05-25 DIAGNOSIS — N200 Calculus of kidney: Secondary | ICD-10-CM | POA: Insufficient documentation

## 2020-05-25 DIAGNOSIS — N201 Calculus of ureter: Secondary | ICD-10-CM

## 2020-05-25 DIAGNOSIS — N23 Unspecified renal colic: Secondary | ICD-10-CM

## 2020-05-25 LAB — POC URINE PREG, ED: Preg Test, Ur: NEGATIVE

## 2020-05-25 LAB — BASIC METABOLIC PANEL
Anion gap: 7 (ref 5–15)
BUN: 14 mg/dL (ref 6–20)
CO2: 24 mmol/L (ref 22–32)
Calcium: 8.6 mg/dL — ABNORMAL LOW (ref 8.9–10.3)
Chloride: 107 mmol/L (ref 98–111)
Creatinine, Ser: 0.62 mg/dL (ref 0.44–1.00)
GFR, Estimated: >60 mL/min (ref >60)
Glucose, Bld: 125 mg/dL — ABNORMAL HIGH (ref 70–99)
Potassium: 4.2 mmol/L (ref 3.5–5.1)
Sodium: 138 mmol/L (ref 135–145)

## 2020-05-25 LAB — URINALYSIS, COMPLETE (UACMP) WITH MICROSCOPIC
Bacteria, UA: NONE SEEN
Bilirubin Urine: NEGATIVE
Glucose, UA: NEGATIVE mg/dL
Ketones, ur: NEGATIVE mg/dL
Leukocytes,Ua: NEGATIVE
Nitrite: NEGATIVE
Protein, ur: 30 mg/dL — AB
RBC / HPF: >50 RBC/hpf — ABNORMAL HIGH (ref 0–5)
Specific Gravity, Urine: 1.021 (ref 1.005–1.030)
pH: 6 (ref 5.0–8.0)

## 2020-05-25 LAB — CBC
HCT: 39.5 % (ref 36.0–46.0)
Hemoglobin: 12.8 g/dL (ref 12.0–15.0)
MCH: 28.2 pg (ref 26.0–34.0)
MCHC: 32.4 g/dL (ref 30.0–36.0)
MCV: 87 fL (ref 80.0–100.0)
Platelets: 331 10*3/uL (ref 150–400)
RBC: 4.54 MIL/uL (ref 3.87–5.11)
RDW: 15.1 % (ref 11.5–15.5)
WBC: 10.8 10*3/uL — ABNORMAL HIGH (ref 4.0–10.5)
nRBC: 0 % (ref 0.0–0.2)

## 2020-05-25 MED ORDER — SODIUM CHLORIDE 0.9 % IV BOLUS (SEPSIS)
1000.0000 mL | Freq: Once | INTRAVENOUS | Status: AC
  Administered 2020-05-25: 1000 mL via INTRAVENOUS

## 2020-05-25 MED ORDER — ONDANSETRON HCL 4 MG/2ML IJ SOLN
4.0000 mg | Freq: Once | INTRAMUSCULAR | Status: AC
  Administered 2020-05-25: 4 mg via INTRAVENOUS
  Filled 2020-05-25: qty 2

## 2020-05-25 MED ORDER — KETOROLAC TROMETHAMINE 30 MG/ML IJ SOLN: 30.0000 mg | Freq: Once | INTRAMUSCULAR | Status: AC

## 2020-05-25 MED ORDER — ACETAMINOPHEN 500 MG PO TABS
1000.0000 mg | ORAL_TABLET | Freq: Once | ORAL | Status: AC
  Administered 2020-05-25: 1000 mg via ORAL
  Filled 2020-05-25: qty 2

## 2020-05-25 MED ORDER — LACTATED RINGERS IV BOLUS
1000.0000 mL | Freq: Once | INTRAVENOUS | Status: AC
  Administered 2020-05-25: 1000 mL via INTRAVENOUS

## 2020-05-25 MED ORDER — ONDANSETRON HCL 4 MG/2ML IJ SOLN: 4.0000 mg | Freq: Once | INTRAMUSCULAR | Status: AC

## 2020-05-25 MED ORDER — FENTANYL CITRATE (PF) 100 MCG/2ML IJ SOLN
50.0000 ug | Freq: Once | INTRAMUSCULAR | Status: AC
  Administered 2020-05-25: 50 ug via INTRAVENOUS
  Filled 2020-05-25: qty 2

## 2020-05-25 MED ORDER — MORPHINE SULFATE (PF) 4 MG/ML IV SOLN
INTRAVENOUS | Status: AC
  Administered 2020-05-25: 4 mg via INTRAVENOUS
  Filled 2020-05-25: qty 1

## 2020-05-25 MED ORDER — TAMSULOSIN HCL 0.4 MG PO CAPS
0.8000 mg | ORAL_CAPSULE | Freq: Once | ORAL | Status: AC
  Administered 2020-05-25: 0.8 mg via ORAL
  Filled 2020-05-25: qty 2

## 2020-05-25 MED ORDER — KETOROLAC TROMETHAMINE 30 MG/ML IJ SOLN
INTRAMUSCULAR | Status: AC
  Administered 2020-05-25: 30 mg via INTRAVENOUS
  Filled 2020-05-25: qty 1

## 2020-05-25 MED ORDER — MORPHINE SULFATE (PF) 4 MG/ML IV SOLN: 4.0000 mg | Freq: Once | INTRAVENOUS | Status: AC

## 2020-05-25 MED ORDER — ONDANSETRON HCL 4 MG/2ML IJ SOLN
INTRAMUSCULAR | Status: AC
  Administered 2020-05-25: 4 mg via INTRAVENOUS
  Filled 2020-05-25: qty 2

## 2020-05-25 MED ORDER — KETOROLAC TROMETHAMINE 30 MG/ML IJ SOLN
15.0000 mg | Freq: Once | INTRAMUSCULAR | Status: DC
  Filled 2020-05-25: qty 1

## 2020-05-25 MED ORDER — OXYCODONE-ACETAMINOPHEN 5-325 MG PO TABS
1.0000 | ORAL_TABLET | ORAL | Status: DC | PRN
  Administered 2020-05-25: 1 via ORAL
  Filled 2020-05-25: qty 1

## 2020-05-25 NOTE — ED Provider Notes (Signed)
  Clinical Course as of 05/25/20 1106  Sat May 25, 2020  4008 Patient received in signout from Dr. Elesa Massed pending clinical reevaluation, improvement of symptoms and clinical passage of ureterolithiasis.  Work-up reviewed with 3 mm proximal stone in the left, urine without infectious features.  I reevaluate the patient, she reports her pain has been controlled, but now is recurring up to 4/10 intensity.  More meds ordered. [DS]  6761 Reassessed.  She reports improving symptoms. [DS]  9509 Reassessed.  Patient reports some incisional discomfort and she reports that she has vomited 1 time.  We discussed the possibility of discussing the case with urology, but she declines this indicating that she has no health insurance and requests that we attempt clinical passage of the stone without their input at this time. [DS]  1024 Reassessed.  Patient reports improving pain.  Refusing second dose of Toradol indicating that she may have passed a stone. [DS]  1105 Reassessed.  Patient up and walking around the room.  Requesting discharge.  We discussed return precautions for the ED. [DS]    Clinical Course User Index [DS] Delton Prairie, MD      Delton Prairie, MD 05/25/20 [phone]

## 2020-05-25 NOTE — Discharge Instructions (Addendum)
Please take Tylenol and ibuprofen/Advil for your pain.  It is safe to take them together, or to alternate them every few hours.  Take up to 1000mg of Tylenol at a time, up to 4 times per day.  Do not take more than 4000 mg of Tylenol in 24 hours.  For ibuprofen, take 400-600 mg, 4-5 times per day. ° ° °

## 2020-05-25 NOTE — ED Provider Notes (Signed)
[facility] Emergency Department Provider Note  ____________________________________________   Event Date/Time   First MD Initiated Contact with Patient 05/25/20 [phone]     (approximate)  I have reviewed the triage vital signs and the nursing notes.   HISTORY  Chief Complaint Flank Pain    HPI Toni Salazar is a 35 y.o. female with history of kidney stones requiring intervention who presents to the emergency department with complaints of left flank pain, nausea.  Symptoms started yesterday then progressively worsened overnight.  They have been waxing and waning.  Feels like her previous kidney stones.  Described as sharp, severe.  No fevers.  Has had hematuria but no dysuria.  On review of records, patient last had a cystoscopy, ureteroscopy with stent placement December 14 2019 by Dr. Richardo Hanks.  Stent was removed January 03 2020.        Past Medical History:  Diagnosis Date  . History of kidney stones   . Kidney stone     Patient Active Problem List   Diagnosis Date Noted  . Right ureteral stone 04/29/2017    Past Surgical History:  Procedure Laterality Date  . CESAREAN SECTION    . CYSTOSCOPY W/ RETROGRADES  12/14/2019   Procedure: CYSTOSCOPY WITH RETROGRADE PYELOGRAM;  Surgeon: Sondra Come, MD;  Location: [facility];  Service: Urology;;  . Bluford Kaufmann WITH STENT PLACEMENT Right 04/29/2017   Procedure: CYSTOSCOPY WITH STENT PLACEMENT;  Surgeon: Vanna Scotland, MD;  Location: [facility];  Service: Urology;  Laterality: Right;  . CYSTOSCOPY WITH URETEROSCOPY Right 05/07/2017   Procedure: CYSTOSCOPY WITH URETEROSCOPY;  Surgeon: Vanna Scotland, MD;  Location: [facility];  Service: Urology;  Laterality: Right;  . CYSTOSCOPY/URETEROSCOPY/HOLMIUM LASER/STENT PLACEMENT Right 12/14/2019   Procedure: CYSTOSCOPY/URETEROSCOPY/HOLMIUM LASER/STENT PLACEMENT;  Surgeon: Sondra Come, MD;  Location: [facility];  Service: Urology;  Laterality: Right;  . STENT  REMOVAL  05/07/2017   Procedure: STENT REMOVAL;  Surgeon: Vanna Scotland, MD;  Location: [facility];  Service: Urology;;    Prior to Admission medications   Medication Sig Start Date End Date Taking? Authorizing Provider  HYDROcodone-acetaminophen (NORCO/VICODIN) 5-325 MG tablet Take 1 tablet by mouth every 6 (six) hours as needed for severe pain. 12/14/19 12/13/20  Sondra Come, MD  Ibuprofen-Diphenhydramine HCl (ADVIL PM) 200-25 MG CAPS Take 2 tablets by mouth at bedtime.     [provider]  tamsulosin (FLOMAX) 0.4 MG CAPS capsule Take 1 capsule (0.4 mg total) by mouth daily after supper. 12/14/19   Sondra Come, MD    Allergies Amoxicillin and Sulfa antibiotics  No family history on file.  Social History Social History   Tobacco Use  . Smoking status: Current Every Day Smoker    Types: Cigarettes  . Smokeless tobacco: Never Used  Vaping Use  . Vaping Use: Never used  Substance Use Topics  . Alcohol use: Yes  . Drug use: No    Review of Systems Constitutional: No fever. Eyes: No visual changes. ENT: No sore throat. Cardiovascular: Denies chest pain. Respiratory: Denies shortness of breath. Gastrointestinal: No vomiting, diarrhea. Genitourinary: Negative for dysuria.  + hematuria. Musculoskeletal: Negative for back pain. Skin: Negative for rash. Neurological: Negative for focal weakness or numbness.  ____________________________________________   PHYSICAL EXAM:  VITAL SIGNS: ED Triage Vitals [05/25/20 0528]  Enc Vitals Group     BP (!) 149/102     Pulse Rate 100     Resp 16     Temp 98.2 F (36.8 C)  Temp src      SpO2 100 %     Weight 210 lb (95.3 kg)     Height 5\' 3"  (1.6 m)     Head Circumference      Peak Flow      Pain Score 7     Pain Loc      Pain Edu?      Excl. in GC?    CONSTITUTIONAL: Alert and oriented and responds appropriately to questions.  Tearful, appears uncomfortable HEAD: Normocephalic EYES: Conjunctivae clear,  pupils appear equal, EOM appear intact ENT: normal nose; moist mucous membranes NECK: Supple, normal ROM CARD: RRR; S1 and S2 appreciated; no murmurs, no clicks, no rubs, no gallops RESP: Normal chest excursion without splinting or tachypnea; breath sounds clear and equal bilaterally; no wheezes, no rhonchi, no rales, no hypoxia or respiratory distress, speaking full sentences ABD/GI: Normal bowel sounds; non-distended; soft, non-tender, no rebound, no guarding, no peritoneal signs, no hepatosplenomegaly BACK: The back appears normal, no midline spinal tenderness, no CVA tenderness EXT: Normal ROM in all joints; no deformity noted, no edema; no cyanosis SKIN: Normal color for age and race; warm; no rash on exposed skin NEURO: Moves all extremities equally, ambulates with steady gait PSYCH: The patient's mood and manner are appropriate.  ____________________________________________   LABS (all labs ordered are listed, but only abnormal results are displayed)  Labs Reviewed  URINALYSIS, COMPLETE (UACMP) WITH MICROSCOPIC - Abnormal; Notable for the following components:      Result Value   Color, Urine YELLOW (*)    APPearance HAZY (*)    Hgb urine dipstick LARGE (*)    Protein, ur 30 (*)    RBC / HPF >50 (*)    All other components within normal limits  CBC - Abnormal; Notable for the following components:   WBC 10.8 (*)    All other components within normal limits  BASIC METABOLIC PANEL - Abnormal; Notable for the following components:   Glucose, Bld 125 (*)    Calcium 8.6 (*)    All other components within normal limits  POC URINE PREG, ED   ____________________________________________  EKG  None ____________________________________________  RADIOLOGY I, Kristen Ward, personally viewed and evaluated these images (plain radiographs) as part of my medical decision making, as well as reviewing the written report by the radiologist.  ED MD interpretation: Left-sided kidney  stone at the UPJ  Official radiology report(s): CT Renal Stone Study  Result Date: 05/25/2020 CLINICAL DATA:  Pt complains of left flank pain and hematuria. Pt denies fever. Pt states did have nausea yesterday but no vomiting EXAM: CT ABDOMEN AND PELVIS WITHOUT CONTRAST TECHNIQUE: Multidetector CT imaging of the abdomen and pelvis was performed following the standard protocol without IV contrast. COMPARISON:  CT kidney 12/14/2019 FINDINGS: Lower chest: No acute abnormality. Hepatobiliary: No focal liver abnormality. No gallstones, gallbladder wall thickening, or pericholecystic fluid. No biliary dilatation. Pancreas: No focal lesion. Normal pancreatic contour. No surrounding inflammatory changes. No main pancreatic ductal dilatation. Spleen: Normal in size without focal abnormality. Adrenals/Urinary Tract: No adrenal nodule bilaterally. Trace perinephric strike stranding of the left kidney. Punctate nephrolithiasis within the right kidney. Previously identified right kidney larger stones not visualized. Interval migration of a 3 mm calcified stone now located at the left ureteropelvic junction with associated mild proximal hydronephrosis. The ureter distally is normal in caliber with no left ureterolithiasis noted distally. No right ureterolithiasis. No contour-deforming renal mass. The urinary bladder is decompressed. Stomach/Bowel: Stomach is within  normal limits. No evidence of bowel wall thickening or dilatation. Few scattered sigmoid diverticula. Appendix appears normal. Vascular/Lymphatic: No significant vascular findings are present. No enlarged abdominal or pelvic lymph nodes. Reproductive: Uterus and bilateral adnexa are unremarkable. Other: No intraperitoneal free fluid. No intraperitoneal free gas. No organized fluid collection. Musculoskeletal: No acute or significant osseous findings. IMPRESSION: 1. Obstructive 3 mm left ureteropelvic junction stone. 2. Nonobstructive punctate right  nephrolithiasis. Interval resolution of previously identified larger right renal stones. 3. Few scattered sigmoid diverticula with no acute diverticulitis. Electronically Signed   By: Tish Frederickson M.D.   On: 05/25/2020 06:13    ____________________________________________   PROCEDURES  Procedure(s) performed (including Critical Care):  Procedures   ____________________________________________   INITIAL IMPRESSION / ASSESSMENT AND PLAN / ED COURSE  As part of my medical decision making, I reviewed the following data within the electronic MEDICAL RECORD NUMBER Nursing notes reviewed and incorporated, Labs reviewed, Ct reviewed, Old chart reviewed, Notes from prior ED visits and  Controlled Substance Database         Patient here with left-sided flank pain.  Extensive history of kidney stones requiring intervention.  No fever, dysuria.  Abdominal exam benign.  Labs in triage show minimal leukocytosis.  Urine shows blood but no other sign of infection.  CT scan shows 3 mm obstructing stone at the left UVJ.  No improvement with oral medications in the waiting room.  Seen in triage.  IV started and given Toradol, morphine, Zofran.  Plan is to continue to work on pain control and hopefully be able to discharge patient home with pain medication and outpatient follow-up with her urologist.  ED PROGRESS  7:00 AM  Singed out to Dr. Katrinka Blazing to reassess patient after pain control for further dispo.  I reviewed all nursing notes and pertinent previous records as available.  I have reviewed and interpreted any EKGs, lab and urine results, imaging (as available).  ____________________________________________   FINAL CLINICAL IMPRESSION(S) / ED DIAGNOSES  Final diagnoses:  Kidney stone on left side     ED Discharge Orders    None      *Please note:  Toni Salazar was evaluated in Emergency Department on 05/25/2020 for the symptoms described in the history of present illness. She was  evaluated in the context of the global COVID-19 pandemic, which necessitated consideration that the patient might be at risk for infection with the SARS-CoV-2 virus that causes COVID-19. Institutional protocols and algorithms that pertain to the evaluation of patients at risk for COVID-19 are in a state of rapid change based on information released by regulatory bodies including the CDC and federal and state organizations. These policies and algorithms were followed during the patient's care in the ED.  Some ED evaluations and interventions may be delayed as a result of limited staffing during and the pandemic.*   Note:  This document was prepared using Dragon voice recognition software and may include unintentional dictation errors.   Ward, Layla Maw, DO 05/25/20 [phone]

## 2020-05-25 NOTE — ED Notes (Signed)
Pt instructed that she may not drive for four hours after percocet administration, pt informed she will need a driver home. Pt verbalizes understanding.

## 2020-05-25 NOTE — ED Triage Notes (Signed)
Pt complains of left flank pain and hematuria. Pt denies fever. Pt states did have nausea yesterday but no vomiting.

## 2020-08-04 ENCOUNTER — Emergency Department
Admission: EM | Admit: 2020-08-04 | Discharge: 2020-08-04 | Disposition: A | Discharge status: Home / Self Care | Payer: Medicaid Other | Attending: Emergency Medicine | Admitting: Emergency Medicine

## 2020-08-04 ENCOUNTER — Encounter: Payer: Self-pay | Admitting: Emergency Medicine

## 2020-08-04 ENCOUNTER — Other Ambulatory Visit: Payer: Self-pay

## 2020-08-04 DIAGNOSIS — N23 Unspecified renal colic: Secondary | ICD-10-CM | POA: Insufficient documentation

## 2020-08-04 DIAGNOSIS — F17210 Nicotine dependence, cigarettes, uncomplicated: Secondary | ICD-10-CM | POA: Insufficient documentation

## 2020-08-04 LAB — URINALYSIS, COMPLETE (UACMP) WITH MICROSCOPIC
Bacteria, UA: NONE SEEN
Bilirubin Urine: NEGATIVE
Glucose, UA: NEGATIVE mg/dL
Ketones, ur: 5 mg/dL — AB
Leukocytes,Ua: NEGATIVE
Nitrite: NEGATIVE
Protein, ur: NEGATIVE mg/dL
Specific Gravity, Urine: 1.014 (ref 1.005–1.030)
pH: 7 (ref 5.0–8.0)

## 2020-08-04 LAB — PREGNANCY, URINE: Preg Test, Ur: NEGATIVE

## 2020-08-04 MED ORDER — KETOROLAC TROMETHAMINE 30 MG/ML IJ SOLN
30.0000 mg | Freq: Once | INTRAMUSCULAR | Status: AC
  Administered 2020-08-04: 30 mg via INTRAVENOUS
  Filled 2020-08-04: qty 1

## 2020-08-04 MED ORDER — ONDANSETRON HCL 4 MG/2ML IJ SOLN
4.0000 mg | Freq: Once | INTRAMUSCULAR | Status: AC
  Administered 2020-08-04: 4 mg via INTRAVENOUS
  Filled 2020-08-04: qty 2

## 2020-08-04 NOTE — ED Provider Notes (Signed)
[facility] Emergency Department Provider Note  ____________________________________________  Time seen: Approximately 4:36 AM  I have reviewed the triage vital signs and the nursing notes.   HISTORY  Chief Complaint Flank Pain   HPI Toni Salazar is a 35 y.o. female with a history of recurrent kidney stones who presents for evaluation of right-sided flank pain.  Patient reports the pain started 2 hours prior to arrival.  She was trying to stay home and not come to be emergency room but the pain was getting progressively worse to the point that it became severe.  Patient reports that the pain was sudden onset, sharp, located on the right flank and radiated to the right lower abdomen.  Associated with nausea and nonbloody nonbilious emesis.  Patient reports that at this point the pain has resolved.  She denies dysuria or hematuria, abdominal pain, chest pain or shortness of breath.   Past Medical History:  Diagnosis Date  . History of kidney stones   . Kidney stone     Patient Active Problem List   Diagnosis Date Noted  . Right ureteral stone 04/29/2017    Past Surgical History:  Procedure Laterality Date  . CESAREAN SECTION    . CYSTOSCOPY W/ RETROGRADES  12/14/2019   Procedure: CYSTOSCOPY WITH RETROGRADE PYELOGRAM;  Surgeon: Sondra Come, MD;  Location: [facility];  Service: Urology;;  . Bluford Kaufmann WITH STENT PLACEMENT Right 04/29/2017   Procedure: CYSTOSCOPY WITH STENT PLACEMENT;  Surgeon: Vanna Scotland, MD;  Location: [facility];  Service: Urology;  Laterality: Right;  . CYSTOSCOPY WITH URETEROSCOPY Right 05/07/2017   Procedure: CYSTOSCOPY WITH URETEROSCOPY;  Surgeon: Vanna Scotland, MD;  Location: [facility];  Service: Urology;  Laterality: Right;  . CYSTOSCOPY/URETEROSCOPY/HOLMIUM LASER/STENT PLACEMENT Right 12/14/2019   Procedure: CYSTOSCOPY/URETEROSCOPY/HOLMIUM LASER/STENT PLACEMENT;  Surgeon: Sondra Come, MD;  Location: [facility];  Service:  Urology;  Laterality: Right;  . STENT REMOVAL  05/07/2017   Procedure: STENT REMOVAL;  Surgeon: Vanna Scotland, MD;  Location: [facility];  Service: Urology;;    Prior to Admission medications   Medication Sig Start Date End Date Taking? Authorizing Provider  HYDROcodone-acetaminophen (NORCO/VICODIN) 5-325 MG tablet Take 1 tablet by mouth every 6 (six) hours as needed for severe pain. 12/14/19 12/13/20  Sondra Come, MD  Ibuprofen-Diphenhydramine HCl (ADVIL PM) 200-25 MG CAPS Take 2 tablets by mouth at bedtime.     [provider]  tamsulosin (FLOMAX) 0.4 MG CAPS capsule Take 1 capsule (0.4 mg total) by mouth daily after supper. 12/14/19   Sondra Come, MD    Allergies Amoxicillin and Sulfa antibiotics  No family history on file.  Social History Social History   Tobacco Use  . Smoking status: Current Every Day Smoker    Types: Cigarettes  . Smokeless tobacco: Never Used  Vaping Use  . Vaping Use: Never used  Substance Use Topics  . Alcohol use: Yes  . Drug use: No    Review of Systems  Constitutional: Negative for fever. Eyes: Negative for visual changes. ENT: Negative for sore throat. Neck: No neck pain  Cardiovascular: Negative for chest pain. Respiratory: Negative for shortness of breath. Gastrointestinal: Negative for abdominal pain, vomiting or diarrhea. Genitourinary: Negative for dysuria. + R flank pain Musculoskeletal: Negative for back pain. Skin: Negative for rash. Neurological: Negative for headaches, weakness or numbness. Psych: No SI or HI  ____________________________________________   PHYSICAL EXAM:  VITAL SIGNS: ED Triage Vitals  Enc Vitals Group  BP 08/04/20 0226 (!) 159/87     Pulse Rate 08/04/20 0226 84     Resp 08/04/20 0226 20     Temp 08/04/20 0226 98.7 F (37.1 C)     Temp Source 08/04/20 0226 Oral     SpO2 08/04/20 0226 100 %     Weight 08/04/20 0227 210 lb (95.3 kg)     Height 08/04/20 0227 5\' 3"  (1.6 m)     Head  Circumference --      Peak Flow --      Pain Score 08/04/20 0227 10     Pain Loc --      Pain Edu? --      Excl. in GC? --     Constitutional: Alert and oriented. Well appearing and in no apparent distress. HEENT:      Head: Normocephalic and atraumatic.         Eyes: Conjunctivae are normal. Sclera is non-icteric.       Mouth/Throat: Mucous membranes are moist.       Neck: Supple with no signs of meningismus. Cardiovascular: Regular rate and rhythm. No murmurs, gallops, or rubs. 2+ symmetrical distal pulses are present in all extremities. No JVD. Respiratory: Normal respiratory effort. Lungs are clear to auscultation bilaterally.  Gastrointestinal: Soft, non tender, and non distended with positive bowel sounds. No rebound or guarding. Genitourinary: No CVA tenderness. Musculoskeletal:  No edema, cyanosis, or erythema of extremities. Neurologic: Normal speech and language. Face is symmetric. Moving all extremities. No gross focal neurologic deficits are appreciated. Skin: Skin is warm, dry and intact. No rash noted. Psychiatric: Mood and affect are normal. Speech and behavior are normal.  ____________________________________________   LABS (all labs ordered are listed, but only abnormal results are displayed)  Labs Reviewed  URINALYSIS, COMPLETE (UACMP) WITH MICROSCOPIC - Abnormal; Notable for the following components:      Result Value   Color, Urine YELLOW (*)    APPearance CLEAR (*)    Hgb urine dipstick MODERATE (*)    Ketones, ur 5 (*)    All other components within normal limits  PREGNANCY, URINE   ____________________________________________  EKG  none  ____________________________________________  RADIOLOGY  none  ____________________________________________   PROCEDURES  Procedure(s) performed: None Procedures Critical Care performed:  None ____________________________________________   INITIAL IMPRESSION / ASSESSMENT AND PLAN / ED COURSE  35 y.o.  female with a history of recurrent kidney stones who presents for evaluation of right-sided flank pain.  Patient reports pain is identical to prior history of kidney stones.  Pain lasted almost 3 hours but has resolved at this time.  She has no tenderness on palpation, she looks otherwise well-appearing with normal vital signs.  Patient declined blood work and imaging since she does not have insurance, she is certain that her symptoms were due to kidney stone, and to have now resolved.  We did do a UA and a pregnancy test which were unremarkable.  Patient received a dose of IV Toradol and Zofran upon arrival in triage.  Discussed my standard return precautions and follow-up with PCP.  Old medical records reviewed showing several prior imagings with diagnosis of kidney stones.      _____________________________________________ Please note:  Patient was evaluated in Emergency Department today for the symptoms described in the history of present illness. Patient was evaluated in the context of the global COVID-19 pandemic, which necessitated consideration that the patient might be at risk for infection with the SARS-CoV-2 virus that causes COVID-19. Institutional  protocols and algorithms that pertain to the evaluation of patients at risk for COVID-19 are in a state of rapid change based on information released by regulatory bodies including the CDC and federal and state organizations. These policies and algorithms were followed during the patient's care in the ED.  Some ED evaluations and interventions may be delayed as a result of limited staffing during the pandemic.   [place] Controlled Substance Database was reviewed by me. ____________________________________________   FINAL CLINICAL IMPRESSION(S) / ED DIAGNOSES   Final diagnoses:  Renal colic on right side      NEW MEDICATIONS STARTED DURING THIS VISIT:  ED Discharge Orders    None       Note:  This document was prepared using Dragon  voice recognition software and may include unintentional dictation errors.    Nita Sickle, MD 08/04/20 [phone]

## 2020-08-04 NOTE — ED Triage Notes (Signed)
Pt reports that she has a kidney stone, it started at 1200 this evening. Pt is pale diapheretic and vomiting. She has hx of kidney stones

## 2020-11-29 ENCOUNTER — Ambulatory Visit
Admission: EM | Admit: 2020-11-29 | Discharge: 2020-11-29 | Disposition: A | Discharge status: Home / Self Care | Payer: Medicaid Other | Attending: Emergency Medicine | Admitting: Emergency Medicine

## 2020-11-29 ENCOUNTER — Encounter: Payer: Self-pay | Admitting: Emergency Medicine

## 2020-11-29 ENCOUNTER — Other Ambulatory Visit: Payer: Self-pay

## 2020-11-29 DIAGNOSIS — R519 Headache, unspecified: Secondary | ICD-10-CM

## 2020-11-29 DIAGNOSIS — R110 Nausea: Secondary | ICD-10-CM

## 2020-11-29 DIAGNOSIS — Z1152 Encounter for screening for COVID-19: Secondary | ICD-10-CM

## 2020-11-29 MED ORDER — NAPROXEN 375 MG PO TABS: 375.0000 mg | ORAL_TABLET | Freq: Two times a day (BID) | ORAL | 0 refills | Status: AC

## 2020-11-29 MED ORDER — KETOROLAC TROMETHAMINE 30 MG/ML IJ SOLN
30.0000 mg | Freq: Once | INTRAMUSCULAR | Status: AC
  Administered 2020-11-29: 30 mg via INTRAMUSCULAR

## 2020-11-29 MED ORDER — ONDANSETRON 4 MG PO TBDP: 4.0000 mg | ORAL_TABLET | Freq: Three times a day (TID) | ORAL | 0 refills | Status: AC | PRN

## 2020-11-29 MED ORDER — ONDANSETRON 4 MG PO TBDP
4.0000 mg | ORAL_TABLET | Freq: Once | ORAL | Status: AC
Start: 2020-11-29 — End: 2020-11-29
  Administered 2020-11-29: 4 mg via ORAL

## 2020-11-29 NOTE — ED Triage Notes (Signed)
Pt here with worst headache of her life accompanied by nausea, fever and body aches for 12 hours.

## 2020-11-29 NOTE — Discharge Instructions (Addendum)
We will call you with any positive results from your COVID-19/influenza testing completed in clinic today.  If you do not receive a phone call from us within the next 2-3 days, check your MyChart for up-to-date health information related to testing completed in clinic today.   For most people this is a self-limiting process and can take anywhere from 7 - 10 days to start feeling better. A cough can last up to 3 weeks. Pay special attention to handwashing as this can help prevent the spread of the virus.   Always read the labels of cough and cold medications as they may contain some of the ingredients below.  Rest, push lots of fluids (especially water), and utilize supportive care for symptoms. You may take acetaminophen (Tylenol) every 4-6 hours and ibuprofen every 6-8 hours for muscle pain, joint pain, headaches (you may also alternate these medications). Mucinex (guaifenesin) may be taken over the counter for cough as needed can loosen phlegm. Please read the instructions and take as directed.  Sudafed (pseudophedrine) is sold behind the counter and can help reduce nasal pressure; avoid taking this if you have high blood pressure or feel jittery. Sudafed PE (phenylephrine) can be a helpful, short-term, over-the-counter alternative to limit side effects or if you have high blood pressure.  Flonase nasal spray can help alleviate congestion and sinus pressure. Many patients choose Afrin as a nasal decongestant; do not use for more than 3 days for risk of rebound (increased symptoms after stopping medication).  Saline nasal sprays or rinses can also help nasal congestion (use bottled or sterile water). Warm tea with lemon and honey can sooth sore throat and cough, as can cough drops.   Return to clinic for high fever not improving with medications, chest pain, difficulty breathing, non-stop vomiting, or coughing blood. Follow-up with your primary care provider if symptoms do not improve as expected in  the next 5-7 days.  

## 2020-11-29 NOTE — ED Provider Notes (Signed)
CHIEF COMPLAINT:   Chief Complaint  Patient presents with   Headache   Nausea   Generalized Body Aches     SUBJECTIVE/HPI:  HPI A very pleasant 35 y.o.Female presents today with worse headache of her life accompanied by nausea, fever and body aches for the last 12 hours.  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  Patient states that one of the residents had recently gotten over COVID. Patient does not report any shortness of breath, chest pain, palpitations, visual changes, weakness, tingling, vomiting, diarrhea, chills.   has a past medical history of History of kidney stones and Kidney stone.  ROS:  Review of Systems See Subjective/HPI Medications, Allergies and Problem List personally reviewed in Epic today OBJECTIVE:   Vitals:   11/29/20 1858  BP: (!) 147/82  Pulse: 97  Resp: 20  Temp: 99.7 F (37.6 C)  SpO2: 97%    Physical Exam   General: Appears well-developed and well-nourished. No acute distress.  HEENT Head: Normocephalic and atraumatic.   Ears: Hearing grossly intact, no drainage or visible deformity.  Nose: No nasal deviation.   Mouth/Throat: No stridor or tracheal deviation.   Eyes: Conjunctivae and EOM are normal. No eye drainage or scleral icterus bilaterally.  Neck: Normal range of motion, neck is supple.  Cardiovascular: Normal rate. Regular rhythm; no murmurs, gallops, or rubs.  Pulm/Chest: No respiratory distress. Breath sounds normal bilaterally without wheezes, rhonchi, or rales.  Neurological: Alert and oriented to person, place, and time.  Speaking in full unlabored sentences.  Patient is a good historian.  Steady gait noted. Skin: Skin is warm and moist.  No rashes, lesions, abrasions or bruising noted to skin.   Psychiatric: Normal mood, affect, behavior, and thought content.   Vital signs and nursing note reviewed.   Patient stable and cooperative with examination. PROCEDURES:    LABS/X-RAYS/EKG/MEDS:    No results found for any visits on 11/29/20.  MEDICAL DECISION MAKING:   Patient presents with worse headache of her life accompanied by nausea, fever and body aches for the last 12 hours.  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  Patient states that one of the residents had recently gotten over COVID. Patient does not report any shortness of breath, chest pain, palpitations, visual changes, weakness, tingling, vomiting, diarrhea, chills.  COVID and flu pending.  Patient received a dose of Zofran and Toradol in clinic today for which she tolerated well.  Rx naproxen and Zofran to the patient's preferred pharmacy to help with headache.  The patient is neurologically appropriate in clinic today and in no acute distress for which case she would need to present to the emergency department immediately.  She is not reporting any red flag signs such as visual changes, unilateral weakness.  Likely, viral illness but unable to rule out COVID-19 or influenza without completing a test in clinic today.  We will call with any positive results and negative results will upload directly to her MyChart account.  ER for any worsening headache, visual changes, new high fever not improving with medications, chest pain, difficulty breathing, nonstop vomiting or coughing up blood.  Patient verbalized understanding and agreed with treatment plan.  Patient stable upon discharge. ASSESSMENT/PLAN:  1. Encounter for screening for COVID-19 - Novel Coronavirus, NAA ([facility]); Standing - Novel Coronavirus, NAA ([facility])  2. Acute intractable headache, unspecified headache type - ketorolac (TORADOL) 30 MG/ML injection 30 mg - Covid-19,  Flu A+B ([facility]); Standing - Covid-19, Flu A+B ([facility]) - naproxen (NAPROSYN) 375 MG tablet; Take 1 tablet (375 mg total) by mouth 2 (two) times daily for 7 days.  Dispense: 14 tablet; Refill: 0  3. Nausea without vomiting - ondansetron (ZOFRAN-ODT)  disintegrating tablet 4 mg - ondansetron (ZOFRAN ODT) 4 MG disintegrating tablet; Take 1 tablet (4 mg total) by mouth every 8 (eight) hours as needed for up to 3 days for nausea or vomiting.  Dispense: 9 tablet; Refill: 0 Instructions about new medications and side effects provided.  Plan:   Discharge Instructions      We will call you with any positive results from your COVID-19/influenza testing completed in clinic today.  If you do not receive a phone call from [place] within the next 2-3 days, check your MyChart for up-to-date health information related to testing completed in clinic today.  For most people this is a self-limiting process and can take anywhere from 7 - 10 days to start feeling better. A cough can last up to 3 weeks. Pay special attention to handwashing as this can help prevent the spread of the virus.   Always read the labels of cough and cold medications as they may contain some of the ingredients below.  Rest, push lots of fluids (especially water), and utilize supportive care for symptoms. You may take acetaminophen (Tylenol) every 4-6 hours and ibuprofen every 6-8 hours for muscle pain, joint pain, headaches (you may also alternate these medications). Mucinex (guaifenesin) may be taken over the counter for cough as needed can loosen phlegm. Please read the instructions and take as directed.  Sudafed (pseudophedrine) is sold behind the counter and can help reduce nasal pressure; avoid taking this if you have high blood pressure or feel jittery. Sudafed PE (phenylephrine) can be a helpful, short-term, over-the-counter alternative to limit side effects or if you have high blood pressure.  Flonase nasal spray can help alleviate congestion and sinus pressure. Many patients choose Afrin as a nasal decongestant; do not use for more than 3 days for risk of rebound (increased symptoms after stopping medication).  Saline nasal sprays or rinses can also help nasal congestion (use  bottled or sterile water). Warm tea with lemon and honey can sooth sore throat and cough, as can cough drops.   Return to clinic for high fever not improving with medications, chest pain, difficulty breathing, non-stop vomiting, or coughing blood. Follow-up with your primary care provider if symptoms do not improve as expected in the next 5-7 days.          Amalia Greenhouse, FNP 11/29/20 1921

## 2020-12-01 LAB — COVID-19, FLU A+B NAA
Influenza A, NAA: NOT DETECTED
Influenza B, NAA: NOT DETECTED
SARS-CoV-2, NAA: DETECTED — AB

## 2020-12-21 ENCOUNTER — Emergency Department: Payer: Self-pay

## 2020-12-21 ENCOUNTER — Emergency Department
Admission: EM | Admit: 2020-12-21 | Discharge: 2020-12-21 | Disposition: A | Discharge status: Home / Self Care | Payer: Self-pay | Attending: Emergency Medicine | Admitting: Emergency Medicine

## 2020-12-21 ENCOUNTER — Other Ambulatory Visit: Payer: Self-pay

## 2020-12-21 DIAGNOSIS — F17210 Nicotine dependence, cigarettes, uncomplicated: Secondary | ICD-10-CM | POA: Insufficient documentation

## 2020-12-21 DIAGNOSIS — K5792 Diverticulitis of intestine, part unspecified, without perforation or abscess without bleeding: Secondary | ICD-10-CM | POA: Insufficient documentation

## 2020-12-21 LAB — CBC
HCT: 38.4 % (ref 36.0–46.0)
Hemoglobin: 12.2 g/dL (ref 12.0–15.0)
MCH: 27.9 pg (ref 26.0–34.0)
MCHC: 31.8 g/dL (ref 30.0–36.0)
MCV: 87.9 fL (ref 80.0–100.0)
Platelets: 410 10*3/uL — ABNORMAL HIGH (ref 150–400)
RBC: 4.37 MIL/uL (ref 3.87–5.11)
RDW: 16 % — ABNORMAL HIGH (ref 11.5–15.5)
WBC: 18.3 10*3/uL — ABNORMAL HIGH (ref 4.0–10.5)
nRBC: 0 % (ref 0.0–0.2)

## 2020-12-21 LAB — COMPREHENSIVE METABOLIC PANEL
ALT: 14 U/L (ref 0–44)
AST: 16 U/L (ref 15–41)
Albumin: 3.8 g/dL (ref 3.5–5.0)
Alkaline Phosphatase: 72 U/L (ref 38–126)
Anion gap: 10 (ref 5–15)
BUN: 11 mg/dL (ref 6–20)
CO2: 24 mmol/L (ref 22–32)
Calcium: 8.8 mg/dL — ABNORMAL LOW (ref 8.9–10.3)
Chloride: 105 mmol/L (ref 98–111)
Creatinine, Ser: 0.6 mg/dL (ref 0.44–1.00)
GFR, Estimated: >60 mL/min (ref >60)
Glucose, Bld: 167 mg/dL — ABNORMAL HIGH (ref 70–99)
Potassium: 3.5 mmol/L (ref 3.5–5.1)
Sodium: 139 mmol/L (ref 135–145)
Total Bilirubin: 0.3 mg/dL (ref 0.3–1.2)
Total Protein: 7 g/dL (ref 6.5–8.1)

## 2020-12-21 LAB — URINALYSIS, COMPLETE (UACMP) WITH MICROSCOPIC
Bilirubin Urine: NEGATIVE
Glucose, UA: NEGATIVE mg/dL
Ketones, ur: 5 mg/dL — AB
Leukocytes,Ua: NEGATIVE
Nitrite: NEGATIVE
Protein, ur: 30 mg/dL — AB
Specific Gravity, Urine: 1.036 — ABNORMAL HIGH (ref 1.005–1.030)
pH: 5 (ref 5.0–8.0)

## 2020-12-21 LAB — POC URINE PREG, ED: Preg Test, Ur: NEGATIVE

## 2020-12-21 LAB — LIPASE, BLOOD: Lipase: 26 U/L (ref 11–51)

## 2020-12-21 MED ORDER — CIPROFLOXACIN HCL 500 MG PO TABS: 500.0000 mg | ORAL_TABLET | Freq: Two times a day (BID) | ORAL | 0 refills | Status: AC

## 2020-12-21 MED ORDER — HYDROCODONE-ACETAMINOPHEN 5-325 MG PO TABS: 1.0000 | ORAL_TABLET | Freq: Four times a day (QID) | ORAL | 0 refills | Status: AC | PRN

## 2020-12-21 MED ORDER — FENTANYL CITRATE PF 50 MCG/ML IJ SOSY
50.0000 ug | PREFILLED_SYRINGE | Freq: Once | INTRAMUSCULAR | Status: AC
  Administered 2020-12-21: 50 ug via INTRAVENOUS
  Filled 2020-12-21: qty 1

## 2020-12-21 MED ORDER — METRONIDAZOLE 500 MG PO TABS: 500.0000 mg | ORAL_TABLET | Freq: Two times a day (BID) | ORAL | 0 refills | Status: AC

## 2020-12-21 MED ORDER — METRONIDAZOLE 500 MG PO TABS
500.0000 mg | ORAL_TABLET | Freq: Once | ORAL | Status: AC
  Administered 2020-12-21: 500 mg via ORAL
  Filled 2020-12-21: qty 1

## 2020-12-21 MED ORDER — SODIUM CHLORIDE 0.9 % IV BOLUS
1000.0000 mL | Freq: Once | INTRAVENOUS | Status: AC
  Administered 2020-12-21: 1000 mL via INTRAVENOUS

## 2020-12-21 MED ORDER — IOHEXOL 350 MG/ML SOLN
80.0000 mL | Freq: Once | INTRAVENOUS | Status: AC | PRN
  Administered 2020-12-21: 80 mL via INTRAVENOUS

## 2020-12-21 MED ORDER — CIPROFLOXACIN HCL 500 MG PO TABS
500.0000 mg | ORAL_TABLET | Freq: Once | ORAL | Status: AC
  Administered 2020-12-21: 500 mg via ORAL
  Filled 2020-12-21: qty 1

## 2020-12-21 NOTE — ED Notes (Signed)
Patient attempting to provide urine specimen.

## 2020-12-21 NOTE — ED Notes (Signed)
Report to John, RN

## 2020-12-21 NOTE — Discharge Instructions (Addendum)
Please seek medical attention for any high fevers, chest pain, shortness of breath, change in behavior, persistent vomiting, bloody stool or any other new or concerning symptoms.  

## 2020-12-21 NOTE — ED Provider Notes (Signed)
[facility] Emergency Department Provider Note   ____________________________________________   I have reviewed the triage vital signs and the nursing notes.   HISTORY  Chief Complaint Abdominal Pain   History limited by: Not Limited   HPI Toni Salazar is a 35 y.o. female who presents to the emergency department today because of concerns for abdominal pain.  Located in the lower abdomen.  It started roughly 4 days ago.  It will somewhat wax and wane in intensity although has been fairly severe throughout the past 4 days.  The patient states that she felt the pain moving around her abdomen so thought it might be related to gas.  She did try some Gas-X without any help.  She has not noticed any change in defecation or urination.  She did not appreciate that she had any fevers.  Patient states she has a history of kidney stones although this did not remind her of her previous kidney stones.  Records reviewed. Per medical record review patient has a history of kidney stones.  Past Medical History:  Diagnosis Date   History of kidney stones    Kidney stone     Patient Active Problem List   Diagnosis Date Noted   Right ureteral stone 04/29/2017    Past Surgical History:  Procedure Laterality Date   CESAREAN SECTION     CYSTOSCOPY W/ RETROGRADES  12/14/2019   Procedure: CYSTOSCOPY WITH RETROGRADE PYELOGRAM;  Surgeon: Sondra Come, MD;  Location: [facility] ORS;  Service: Urology;;   CYSTOSCOPY WITH STENT PLACEMENT Right 04/29/2017   Procedure: CYSTOSCOPY WITH STENT PLACEMENT;  Surgeon: Vanna Scotland, MD;  Location: [facility] OR[facility];  Service: Urology;  Laterality: Right;   CYSTOSCOPY WITH URETEROSCOPY Right 05/07/2017   Procedure: CYSTOSCOPY WITH URETEROSCOPY;  Surgeon: Vanna Scotland, MD;  Location: [facility];  Service: Urology;  Laterality: Right;   CYSTOSCOPY/URETEROSCOPY/HOLMIUM LASER/STENT PLACEMENT Right 12/14/2019   Procedure: CYSTOSCOPY/URETEROSCOPY/HOLMIUM  LASER/STENT PLACEMENT;  Surgeon: Sondra Come, MD;  Location: [facility];  Service: Urology;  Laterality: Right;   STENT REMOVAL  05/07/2017   Procedure: STENT REMOVAL;  Surgeon: Vanna Scotland, MD;  Location: [facility];  Service: Urology;;    Prior to Admission medications   Medication Sig Start Date End Date Taking? Authorizing Provider  Ibuprofen-Diphenhydramine HCl (ADVIL PM) 200-25 MG CAPS Take 2 tablets by mouth at bedtime.     [provider]  tamsulosin (FLOMAX) 0.4 MG CAPS capsule Take 1 capsule (0.4 mg total) by mouth daily after supper. 12/14/19   Sondra Come, MD    Allergies Amoxicillin and Sulfa antibiotics  No family history on file.  Social History Social History   Tobacco Use   Smoking status: Every Day    Types: Cigarettes   Smokeless tobacco: Never  Vaping Use   Vaping Use: Never used  Substance Use Topics   Alcohol use: Yes   Drug use: No    Review of Systems Constitutional: No fever/chills Eyes: No visual changes. ENT: No sore throat. Cardiovascular: Denies chest pain. Respiratory: Denies shortness of breath. Gastrointestinal: Positive for abdominal pain. Genitourinary: Negative for dysuria. Musculoskeletal: Positive for back pain. Skin: Negative for rash. Neurological: Negative for headaches, focal weakness or numbness.  ____________________________________________   PHYSICAL EXAM:  VITAL SIGNS: ED Triage Vitals  Enc Vitals Group     BP 12/21/20 1515 (!) 135/112     Pulse Rate 12/21/20 1515 (!) 108     Resp 12/21/20 1515 18     Temp 12/21/20  1515 (!) 101.3 F (38.5 C)     Temp Source 12/21/20 1515 Oral     SpO2 12/21/20 1515 97 %     Weight 12/21/20 1513 212 lb (96.2 kg)     Height 12/21/20 1513 5\' 3"  (1.6 m)     Head Circumference --      Peak Flow --      Pain Score 12/21/20 1513 5    Constitutional: Alert and oriented.  Eyes: Conjunctivae are normal.  ENT      Head: Normocephalic and atraumatic.      Nose: No  congestion/rhinnorhea.      Mouth/Throat: Mucous membranes are moist.      Neck: No stridor. Hematological/Lymphatic/Immunilogical: No cervical lymphadenopathy. Cardiovascular: Tachycardic, regular rhythm.  No murmurs, rubs, or gallops.  Respiratory: Normal respiratory effort without tachypnea nor retractions. Breath sounds are clear and equal bilaterally. No wheezes/rales/rhonchi. Gastrointestinal: Soft and tender to palpation in the suprapubic region. Genitourinary: Deferred Musculoskeletal: Normal range of motion in all extremities. No lower extremity edema. Neurologic:  Normal speech and language. No gross focal neurologic deficits are appreciated.  Skin:  Skin is warm, dry and intact. No rash noted. Psychiatric: Mood and affect are normal. Speech and behavior are normal. Patient exhibits appropriate insight and judgment.  ____________________________________________    LABS (pertinent positives/negatives)  CMP wnl except glu 167, ca 8.8 CBC wbc 18.3, hgb 12.2, plt 410 Lipase 26 UA cloudy, small hgb dipstick, 11-20 rbc, 6-10 wbc, 6-10 squamous epi ____________________________________________   EKG  None  ____________________________________________    RADIOLOGY  CT abd/pel Diverticulitis   ____________________________________________   PROCEDURES  Procedures  ____________________________________________   INITIAL IMPRESSION / ASSESSMENT AND PLAN / ED COURSE  Pertinent labs & imaging results that were available during my care of the patient were reviewed by me and considered in my medical decision making (see chart for details).  Patient presented to the emergency department today because of concerns for abdominal pain.  On exam patient is tender in the suprapubic region.  Did have fever and elevated white count.  Did have concerns for possible intra-abdominal infection so CT abdomen pelvis was obtained.  This is consistent with diverticulitis without any  abscess or evidence of perforation.  I did discuss this finding with the patient.  Will plan on giving first dose of antibiotics here in the emergency department.  Will discharge with further antibiotics and pain medication.   ____________________________________________   FINAL CLINICAL IMPRESSION(S) / ED DIAGNOSES  Final diagnoses:  Diverticulitis     Note: This dictation was prepared with Dragon dictation. Any transcriptional errors that result from this process are unintentional     02/20/21, MD 12/21/20 1945

## 2020-12-21 NOTE — ED Triage Notes (Signed)
Pt states that she has had abd pain that started on the left, went to the right, and is now lower back pain with lower abd pressure- pt states that the pain started 4 days ago- pt states she has had issues with UTIs and kidney stones before but this feels different

## 2020-12-21 NOTE — ED Notes (Signed)
Attempted to call irene rn night rn no answer, messaged rn to have floor rn review purple man no answer

## 2020-12-23 LAB — URINE CULTURE: Culture: 60000 — AB

## 2020-12-26 LAB — CULTURE, BLOOD (SINGLE)
Culture: NO GROWTH
Special Requests: ADEQUATE

## 2022-04-08 IMAGING — CT CT ABD-PELV W/ CM
2 of 4 series · 16 of 46 positions shown, 18 images · IV contrast (APPLIED)
Comparison: 05/25/2020

CLINICAL DATA: Lower abdominal pain. Abdominal pain started on the
left, radiating to the right, and is now in the low back. Pain
started 4 days ago.

EXAM:
CT ABDOMEN AND PELVIS WITH CONTRAST
TECHNIQUE: Multidetector CT imaging of the abdomen and pelvis was performed
using the standard protocol following bolus administration of
intravenous contrast.
CONTRAST:  80mL OMNIPAQUE IOHEXOL 350 MG/ML SOLN

[Series 2: routine abd/pel with · axial · 0.88mm/px · z∈[-1229,-784]mm · 13 of 99 slices shown, 15 images]
[im 5/99  soft-tissue]
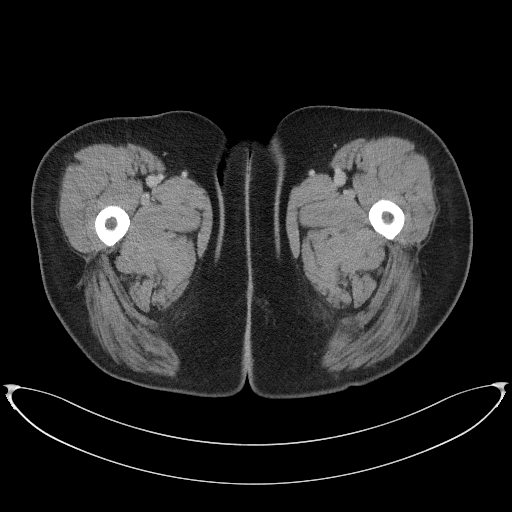
[im 5/99  bone]
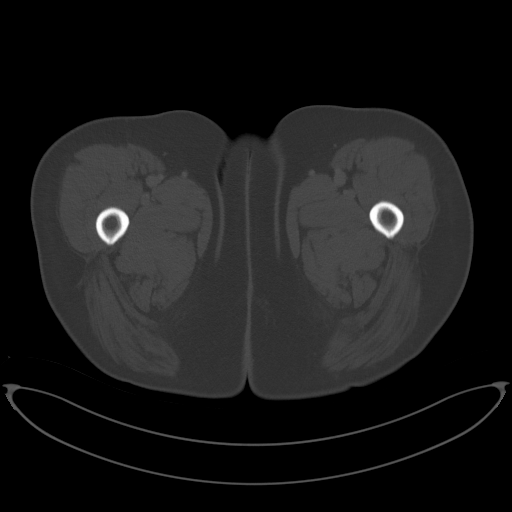
[im 13/99  soft-tissue]
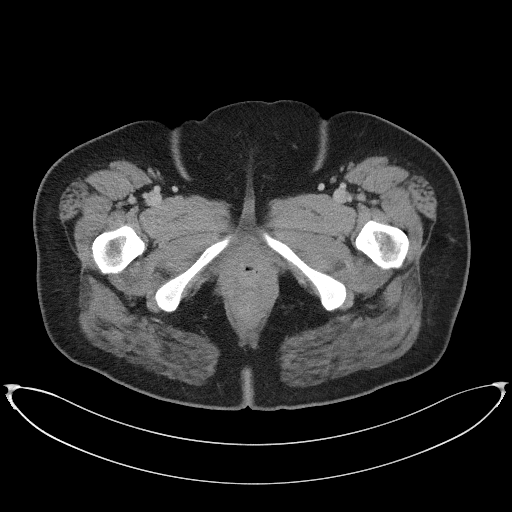
[im 21/99  soft-tissue]
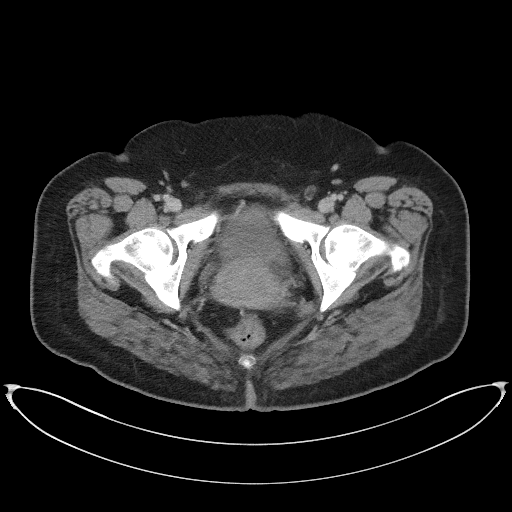
[im 29/99  soft-tissue]
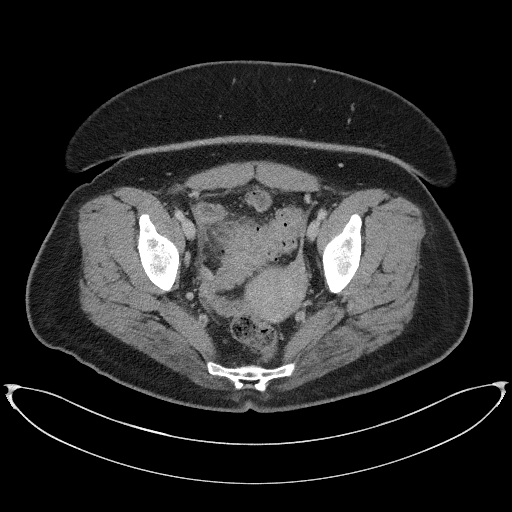
[im 33/99  soft-tissue]
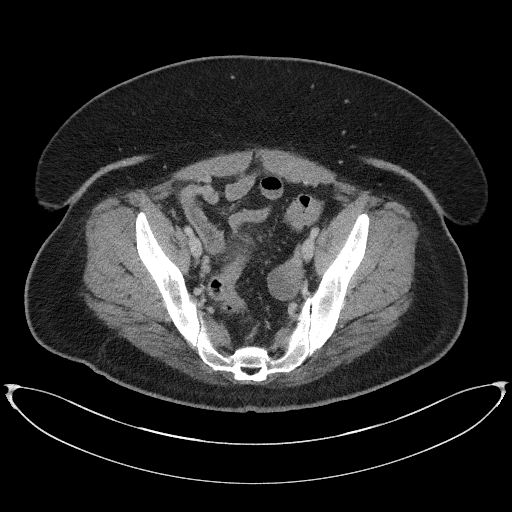
[im 41/99  soft-tissue]
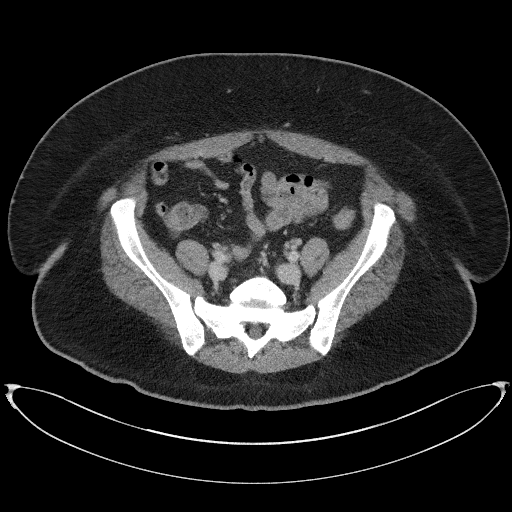
[im 50/99  soft-tissue]
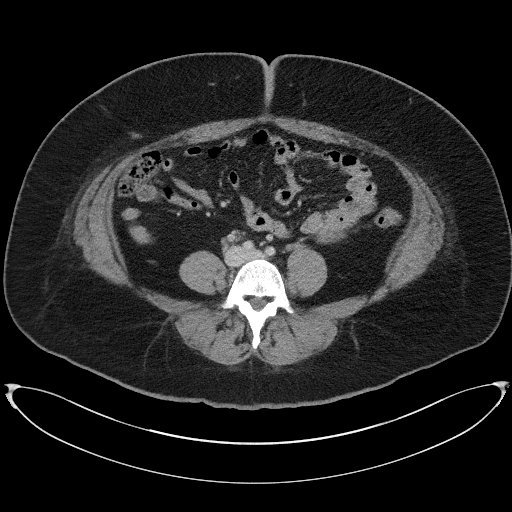
[im 58/99  soft-tissue]
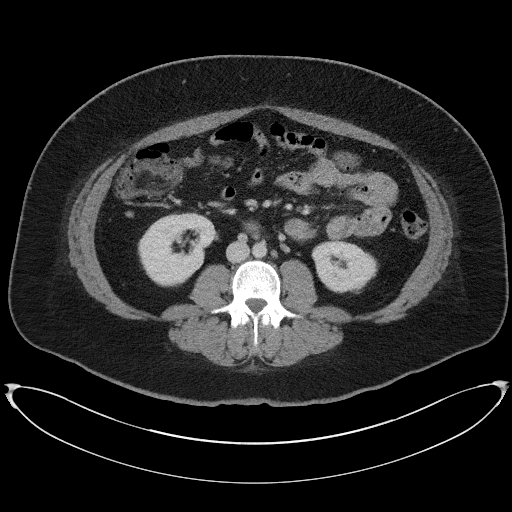
[im 66/99  soft-tissue]
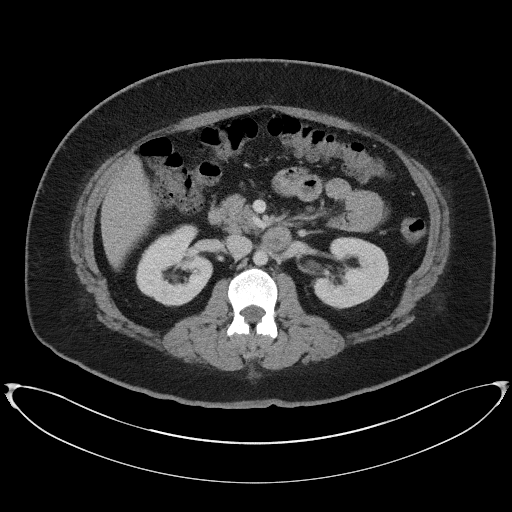
[im 66/99  bone]
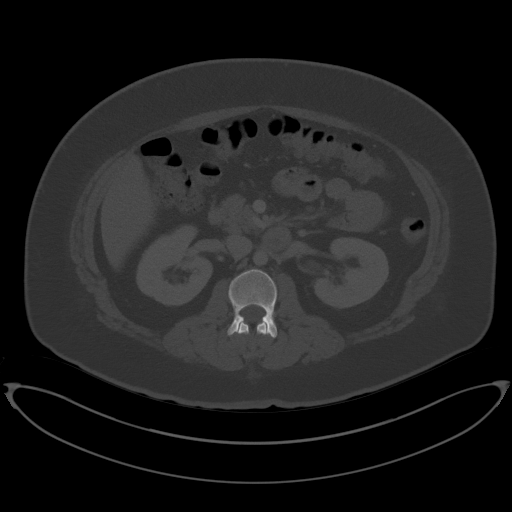
[im 70/99  soft-tissue]
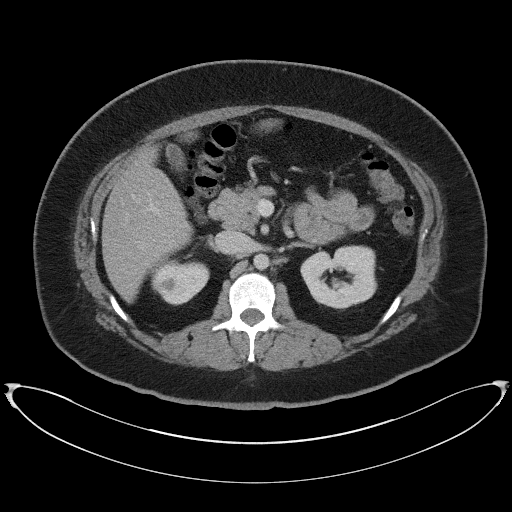
[im 78/99  soft-tissue]
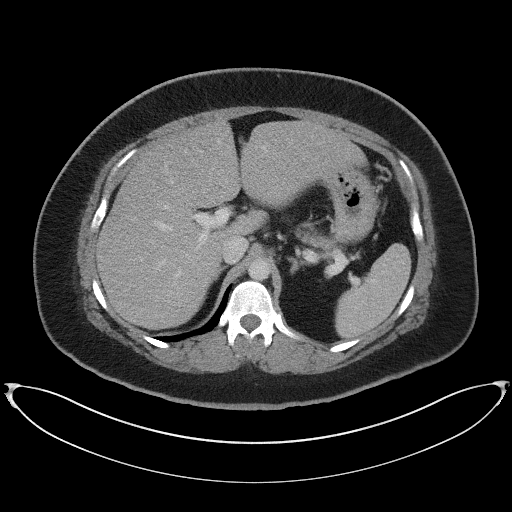
[im 86/99  soft-tissue]
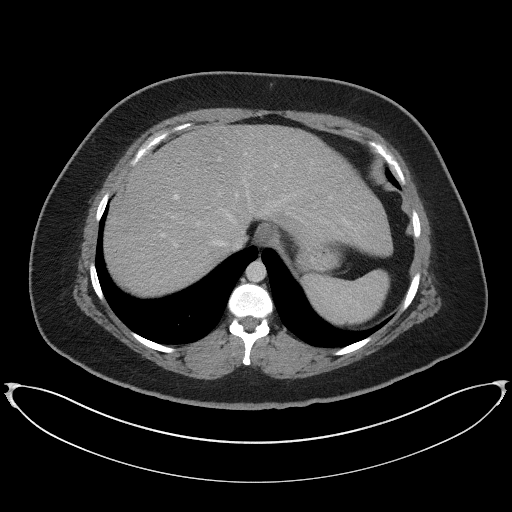
[im 94/99  soft-tissue]
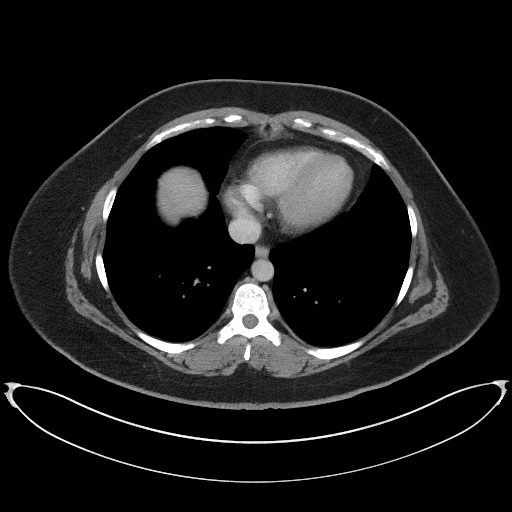

[Series 5: coronal st · coronal · 0.90mm/px · 3 of 118 slices shown]
[im 40/118  soft-tissue]
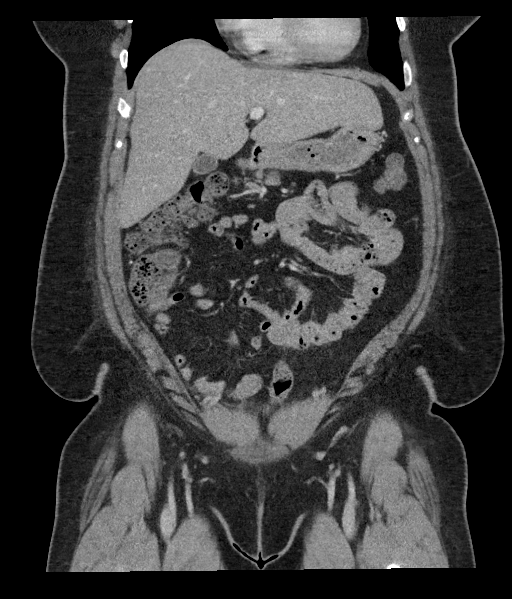
[im 53/118  soft-tissue]
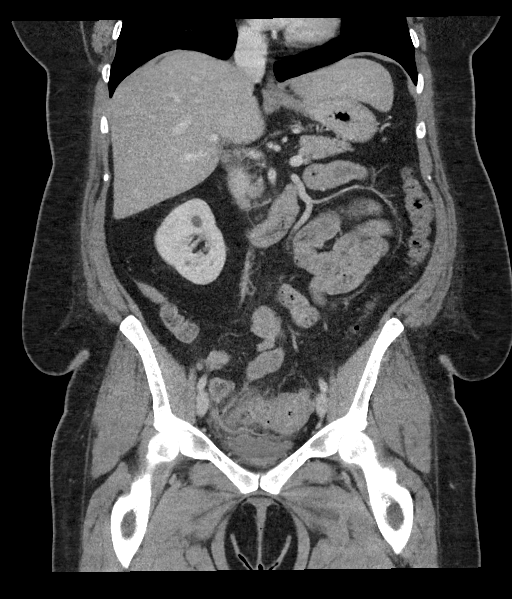
[im 66/118  soft-tissue]
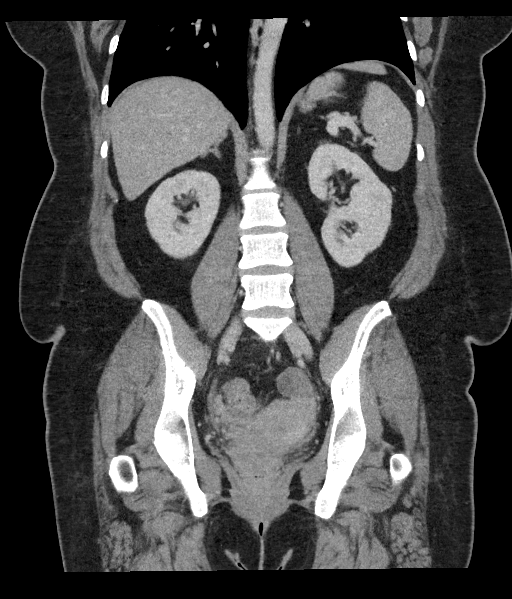

[16 of 46 positions shown; findings below may reference images not displayed]

FINDINGS: Lower chest: Lung bases are clear.

Hepatobiliary: Mild diffuse fatty infiltration of the liver. No
focal lesions. Portal veins are patent. Gallbladder and bile ducts
are unremarkable.

Pancreas: Unremarkable. No pancreatic ductal dilatation or
surrounding inflammatory changes.

Spleen: Normal in size without focal abnormality.

Adrenals/Urinary Tract: No adrenal gland nodules. Renal nephrograms
are symmetrical. Subcentimeter cysts on the right. Stone in the
upper pole of the right kidney measuring 4 mm diameter. No change in
position since prior study. No hydronephrosis or hydroureter. No
ureteral stones. Bladder is decompressed.

Stomach/Bowel: Stomach, small bowel, and colon are not abnormally
distended. Diverticulosis of the sigmoid colon with pericolonic fat
stranding around the sigmoid region. Tiny loculated extraluminal gas
collection. Changes are consistent with acute diverticulitis. No
loculated abscess. Appendix is normal.

Vascular/Lymphatic: No significant vascular findings are present. No
enlarged abdominal or pelvic lymph nodes.

Reproductive: Uterus and bilateral adnexa are unremarkable.

Other: Small amount of free fluid in the pelvis is likely reactive.
No free air. Abdominal wall musculature appears intact.

Musculoskeletal: No acute or significant osseous findings.
IMPRESSION: 1. Colonic diverticulosis with inflammatory changes consistent with
acute diverticulitis in the sigmoid region. No abscess. Small amount
of free fluid is likely reactive.
2. Nonobstructing intrarenal stone in the right upper pole. No
hydronephrosis or hydroureter.
3. Mild fatty infiltration of the liver.

## 2022-12-15 ENCOUNTER — Encounter: Payer: Self-pay | Admitting: Primary Care

## 2022-12-15 ENCOUNTER — Other Ambulatory Visit: Payer: Self-pay | Admitting: Primary Care

## 2022-12-15 ENCOUNTER — Ambulatory Visit: Payer: 59 | Admitting: Primary Care

## 2022-12-15 VITALS — BP 128/84 | HR 100 | Temp 98.0°F | Ht 61.0 in | Wt 263.0 lb

## 2022-12-15 DIAGNOSIS — R400 Somnolence: Secondary | ICD-10-CM | POA: Insufficient documentation

## 2022-12-15 DIAGNOSIS — R600 Localized edema: Secondary | ICD-10-CM | POA: Diagnosis not present

## 2022-12-15 DIAGNOSIS — E1165 Type 2 diabetes mellitus with hyperglycemia: Secondary | ICD-10-CM | POA: Insufficient documentation

## 2022-12-15 DIAGNOSIS — G43009 Migraine without aura, not intractable, without status migrainosus: Secondary | ICD-10-CM | POA: Diagnosis not present

## 2022-12-15 DIAGNOSIS — R7989 Other specified abnormal findings of blood chemistry: Secondary | ICD-10-CM

## 2022-12-15 DIAGNOSIS — G43909 Migraine, unspecified, not intractable, without status migrainosus: Secondary | ICD-10-CM | POA: Insufficient documentation

## 2022-12-15 DIAGNOSIS — E559 Vitamin D deficiency, unspecified: Secondary | ICD-10-CM | POA: Insufficient documentation

## 2022-12-15 LAB — COMPREHENSIVE METABOLIC PANEL
ALT: 34 U/L (ref 0–35)
AST: 34 U/L (ref 0–37)
Albumin: 4.1 g/dL (ref 3.5–5.2)
Alkaline Phosphatase: 92 U/L (ref 39–117)
BUN: 10 mg/dL (ref 6–23)
CO2: 29 meq/L (ref 19–32)
Calcium: 9.6 mg/dL (ref 8.4–10.5)
Chloride: 104 meq/L (ref 96–112)
Creatinine, Ser: 0.68 mg/dL (ref 0.40–1.20)
GFR: 112.15 mL/min (ref >60.00)
Glucose, Bld: 132 mg/dL — ABNORMAL HIGH (ref 70–99)
Potassium: 4.3 meq/L (ref 3.5–5.1)
Sodium: 141 meq/L (ref 135–145)
Total Bilirubin: 0.4 mg/dL (ref 0.2–1.2)
Total Protein: 6.8 g/dL (ref 6.0–8.3)

## 2022-12-15 LAB — CBC
HCT: 39.8 % (ref 36.0–46.0)
Hemoglobin: 12.3 g/dL (ref 12.0–15.0)
MCHC: 30.8 g/dL (ref 30.0–36.0)
MCV: 83.6 fL (ref 78.0–100.0)
Platelets: 392 10*3/uL (ref 150.0–400.0)
RBC: 4.77 Mil/uL (ref 3.87–5.11)
RDW: 17.4 % — ABNORMAL HIGH (ref 11.5–15.5)
WBC: 11.7 10*3/uL — ABNORMAL HIGH (ref 4.0–10.5)

## 2022-12-15 LAB — TSH: TSH: 1.74 u[IU]/mL (ref 0.35–5.50)

## 2022-12-15 LAB — VITAMIN D 25 HYDROXY (VIT D DEFICIENCY, FRACTURES): VITD: <7 ng/mL — ABNORMAL LOW (ref 30.00–100.00)

## 2022-12-15 LAB — VITAMIN B12: Vitamin B-12: 123 pg/mL — ABNORMAL LOW (ref 211–911)

## 2022-12-15 LAB — HEMOGLOBIN A1C: Hgb A1c MFr Bld: 7 % — ABNORMAL HIGH (ref 4.6–6.5)

## 2022-12-15 MED ORDER — METFORMIN HCL ER 500 MG PO TB24: 500.0000 mg | ORAL_TABLET | Freq: Every day | ORAL | 0 refills | Status: DC

## 2022-12-15 MED ORDER — VITAMIN D (ERGOCALCIFEROL) 1.25 MG (50000 UNIT) PO CAPS: ORAL_CAPSULE | ORAL | 0 refills | Status: DC

## 2022-12-15 NOTE — Assessment & Plan Note (Signed)
Differentials include sleep apnea, hypothyroidism, vitamin deficiency, anemia, other metabolic cause.  Referral placed to pulmonology for sleep study. Labs pending today including TSH, A1c, CBC, CMP, vitamin D and B12.  Await results.

## 2022-12-15 NOTE — Progress Notes (Signed)
Subjective:    Patient ID: Toni Salazar, female    DOB: October 01, 1985, 37 y.o.   MRN: [id number]  HPI  Toni Salazar is a very pleasant 37 y.o. female who presents today who presents today to establish care and discuss the problems mentioned below. Will obtain/review records.  History of diverticuloses with acute diverticulitis, obstructive and non obstructive renal stones.   1) Daytime Tiredness: Over the last 1 year she's noticed daytime tiredness, will "nod off" within an hour after getting to work (5:15 am) and intermittently throughout the work day if she is not engaged in an activity. Mostly occurs when looking at a TV screen, computer screen, phone screen, and after eating. She denies changes in her schedule. She's sleeps about 8-9 hours nightly, does get up a few times during the night. She does wake during the night coughing, is a smoker. She has been told that she snores. She completed a sleep apnea test 10 years ago which was negative. Family history of sleep apnea in her mother.   2) Lower Extremity Swelling: Chronic for the last 3-4 months. Occurs to the bilateral lower extremities proximal to the ankle throughout the feet and toes. Upon waking in the morning her swelling is completely resolved, and gradually increases throughout the day. By the end of the day her swelling is moderate to severe with pain, has to lay down for relief. She sits at a desk for 9 hours daily, does try to get up a few times daily to walk around.    3) Migraines/Frequent Headaches: Chronic since childhood. Overall migraines have become less frequent, occurring 3 times per year, typically treats with vomiting and a dark room. She's tried Excedrin Migraine, Tylenol and Ibuprofen without improvement. She is not interested in treatment.   4) Anxiety/Depression/PTSD/ADHD: Chronic history of anxiety since high school, previously managed on numerous medication regimens but she pulled herself off all medications  due to side effects and because of the way her medications made her feel.  Lexapro caused difficulty sleeping, didn't sleep for 3 days. Overall she manages her symptoms on her own. She has no concerns today.  BP Readings from Last 3 Encounters:  12/15/22 128/84  12/21/20 105/71  11/29/20 (!) 147/82    Wt Readings from Last 3 Encounters:  12/15/22 263 lb (119.3 kg)  12/21/20 212 lb (96.2 kg)  08/04/20 210 lb (95.3 kg)     Review of Systems  Constitutional:  Positive for fatigue.  Respiratory:  Negative for shortness of breath.   Cardiovascular:  Positive for leg swelling. Negative for chest pain.  Gastrointestinal:  Negative for constipation.  Musculoskeletal:  Positive for myalgias.  Skin:  Negative for color change.  Neurological:  Negative for headaches.  Psychiatric/Behavioral:  The patient is not nervous/anxious.          Past Medical History:  Diagnosis Date   Acute diverticulitis    GAD (generalized anxiety disorder)    History of kidney stones    Kidney stone    PTSD (post-traumatic stress disorder)    Right ureteral stone 04/29/2017    Social History   Socioeconomic History   Marital status: Single    Spouse name: Not on file   Number of children: Not on file   Years of education: Not on file   Highest education level: Not on file  Occupational History   Not on file  Tobacco Use   Smoking status: Every Day    Types: Cigarettes  Smokeless tobacco: Never  Vaping Use   Vaping status: Never Used  Substance and Sexual Activity   Alcohol use: Yes   Drug use: No   Sexual activity: Yes    Birth control/protection: None  Other Topics Concern   Not on file  Social History Narrative   Not on file   Social Determinants of Health   Financial Resource Strain: Not on file  Food Insecurity: Not on file  Transportation Needs: Not on file  Physical Activity: Not on file  Stress: Not on file  Social Connections: Not on file  Intimate Partner Violence:  Not on file    Past Surgical History:  Procedure Laterality Date   CESAREAN SECTION     CYSTOSCOPY W/ RETROGRADES  12/14/2019   Procedure: CYSTOSCOPY WITH RETROGRADE PYELOGRAM;  Surgeon: Sondra Come, MD;  Location: [facility];  Service: Urology;;   CYSTOSCOPY WITH STENT PLACEMENT Right 04/29/2017   Procedure: CYSTOSCOPY WITH STENT PLACEMENT;  Surgeon: Vanna Scotland, MD;  Location: [facility];  Service: Urology;  Laterality: Right;   CYSTOSCOPY WITH URETEROSCOPY Right 05/07/2017   Procedure: CYSTOSCOPY WITH URETEROSCOPY;  Surgeon: Vanna Scotland, MD;  Location: [facility];  Service: Urology;  Laterality: Right;   CYSTOSCOPY/URETEROSCOPY/HOLMIUM LASER/STENT PLACEMENT Right 12/14/2019   Procedure: CYSTOSCOPY/URETEROSCOPY/HOLMIUM LASER/STENT PLACEMENT;  Surgeon: Sondra Come, MD;  Location: [facility];  Service: Urology;  Laterality: Right;   STENT REMOVAL  05/07/2017   Procedure: STENT REMOVAL;  Surgeon: Vanna Scotland, MD;  Location: [facility];  Service: Urology;;    Family History  Problem Relation Age of Onset   Arthritis Mother    Heart attack Mother    Diabetes type II Mother    Hypertension Mother    Hyperlipidemia Mother    Heart attack Father    Diabetes type II Father    Hypertension Father    Arthritis Father     Allergies  Allergen Reactions   Amoxicillin Hives and Swelling    Has patient had a PCN reaction causing immediate rash, facial/tongue/throat swelling, SOB or lightheadedness with hypotension: No Has patient had a PCN reaction causing severe rash involving mucus membranes or skin necrosis: No Has patient had a PCN reaction that required hospitalization: No Has patient had a PCN reaction occurring within the last 10 years: Yes If all of the above answers are "NO", then may proceed with Cephalosporin use.    Sulfa Antibiotics Hives    No current outpatient medications on file prior to visit.   No current facility-administered medications on file prior to  visit.    BP 128/84   Pulse 100   Temp 98 F (36.7 C) (Temporal)   Ht 5\' 1"  (1.549 m)   Wt 263 lb (119.3 kg)   LMP 11/29/2022 (Exact Date)   SpO2 97%   BMI 49.69 kg/m  Objective:   Physical Exam Cardiovascular:     Rate and Rhythm: Normal rate and regular rhythm.     Comments: Trace lower extremity swelling noted mostly at sock line at ankles. Trace pitting. Pulmonary:     Effort: Pulmonary effort is normal.     Breath sounds: Normal breath sounds.  Musculoskeletal:     Cervical back: Neck supple.  Skin:    General: Skin is warm and dry.  Neurological:     Mental Status: She is alert and oriented to person, place, and time.  Psychiatric:        Mood and Affect: Mood normal.  Assessment & Plan:  Daytime sleepiness Assessment & Plan: Differentials include sleep apnea, hypothyroidism, vitamin deficiency, anemia, other metabolic cause.  Referral placed to pulmonology for sleep study. Labs pending today including TSH, A1c, CBC, CMP, vitamin D and B12.  Await results.  Orders: -     Hemoglobin A1c -     Comprehensive metabolic panel -     TSH -     CBC -     Vitamin B12 -     VITAMIN D 25 Hydroxy (Vit-D Deficiency, Fractures) -     Ambulatory referral to Pulmonology  Bilateral lower extremity edema Assessment & Plan: More likely lymphedema from obesity and sedentary position.  Checking labs today to rule out other metabolic causes.  Discussed the use of compression socks and elevating legs during the day. Await lab results.  Orders: -     Hemoglobin A1c -     Comprehensive metabolic panel -     TSH -     CBC -     Vitamin B12 -     VITAMIN D 25 Hydroxy (Vit-D Deficiency, Fractures)  Migraine without aura and without status migrainosus, not intractable Assessment & Plan: Overall controlled and infrequent.  Offered abortive therapy such as Imitrex for which she kindly declines. She will update if migraines become more  frequent.         Doreene Nest, NP

## 2022-12-15 NOTE — Patient Instructions (Addendum)
Stop by the lab prior to leaving today. I will notify you of your results once received.   You will either be contacted via phone regarding your referral to pulmonology for the sleep study, or you may receive a letter on your MyChart portal from our referral team with instructions for scheduling an appointment. Please let us know if you have not been contacted by anyone within two weeks.  Get some compression socks as discussed.  It was a pleasure to meet you today! Please don't hesitate to contact me with any questions. Welcome to [facility]!

## 2022-12-15 NOTE — Assessment & Plan Note (Signed)
Overall controlled and infrequent.  Offered abortive therapy such as Imitrex for which she kindly declines. She will update if migraines become more frequent.

## 2022-12-15 NOTE — Assessment & Plan Note (Signed)
More likely lymphedema from obesity and sedentary position.  Checking labs today to rule out other metabolic causes.  Discussed the use of compression socks and elevating legs during the day. Await lab results.

## 2022-12-16 ENCOUNTER — Other Ambulatory Visit: Payer: Self-pay | Admitting: Primary Care

## 2022-12-16 ENCOUNTER — Ambulatory Visit (INDEPENDENT_AMBULATORY_CARE_PROVIDER_SITE_OTHER): Payer: 59

## 2022-12-16 DIAGNOSIS — G43009 Migraine without aura, not intractable, without status migrainosus: Secondary | ICD-10-CM

## 2022-12-16 DIAGNOSIS — R7989 Other specified abnormal findings of blood chemistry: Secondary | ICD-10-CM

## 2022-12-16 LAB — IBC + FERRITIN
Ferritin: 20.2 ng/mL (ref 10.0–291.0)
Iron: 38 ug/dL — ABNORMAL LOW (ref 42–145)
Saturation Ratios: 8.1 % — ABNORMAL LOW (ref 20.0–50.0)
TIBC: 471.8 ug/dL — ABNORMAL HIGH (ref 250.0–450.0)
Transferrin: 337 mg/dL (ref 212.0–360.0)

## 2022-12-16 MED ORDER — SUMATRIPTAN SUCCINATE 50 MG PO TABS
ORAL_TABLET | ORAL | 0 refills | Status: DC
Start: 2022-12-16 — End: 2023-01-26

## 2022-12-17 ENCOUNTER — Telehealth: Payer: Self-pay | Admitting: Primary Care

## 2022-12-17 ENCOUNTER — Telehealth: Payer: Self-pay

## 2022-12-17 ENCOUNTER — Other Ambulatory Visit (HOSPITAL_COMMUNITY): Payer: Self-pay

## 2022-12-17 LAB — PATHOLOGIST SMEAR REVIEW

## 2022-12-17 NOTE — Telephone Encounter (Signed)
Pharmacy Patient Advocate Encounter   Received notification from Physician's Office that prior authorization for [facility] is required/requested.   Insurance verification completed.   The patient is insured through [facility] .   Per test claim: PA required; PA submitted to [facility] via CoverMyMeds Key/confirmation #/EOC (Key: [id number])  Status is pending

## 2022-12-17 NOTE — Telephone Encounter (Signed)
FYI: This call has been transferred to Access Nurse. Once the result note has been entered staff can address the message at that time.  Patient called in with the following symptoms:  Red Word:dizziness    Please advise at Mobile [phone] (mobile)  Message is routed to Provider Pool and [facility] Triage   Pt called stating she since she's started taking metFORMIN (GLUCOPHAGE-XR) 500 MG 24 hr tablet, she's been experiencing vertigo, cramping & shakiness. Pt states she's also cannot stop using the bathroom. No other symptoms. Transferred to access nurse.

## 2022-12-18 NOTE — Telephone Encounter (Signed)
Sorry to hear about the symptoms.  Have her update [place] if she continues to experience those symptoms.  If she can tolerate, have her take the metformin for at least for 1 week as symptoms should improve.  Make sure she takes the metformin with breakfast or something on her stomach every day.

## 2022-12-18 NOTE — Telephone Encounter (Signed)
I spoke with pt; pt started metformin 500 mg on 12/17/22 at 5:30 AM at breakfast; pt said at 1 PM ON 12/17/22 pt had intermittent sharpe upper and lower abd pain, then liquid diarrhea, pt felt drowsy, shaky, nauseated and dizzy where room was spinning. No fever and no vomiting. Pt began to feel better after 6 pm last night. Pt woke up at 3:45 AM to get ready for work and pt ate breakfast and took metformin 500 mg again. Pt has not started vitamins. Offered pt an appt and she said she could not afford to come in and wants note sent to Allayne Gitelman NP and then cb requested. [place] pharmacy. Sending note to Allayne Gitelman NP, [facility] pool and will teams [facility] CMA.

## 2022-12-18 NOTE — Telephone Encounter (Signed)
Spoke to pt

## 2022-12-21 NOTE — Telephone Encounter (Signed)
Pharmacy Patient Advocate Encounter  Received notification from [facility] that Prior Authorization for Ozempic has been DENIED.  Full denial letter will be uploaded to the media tab. See denial reason below.         PA #/Case ID/Reference #: (Key: [id number])

## 2022-12-21 NOTE — Telephone Encounter (Signed)
Will you notify patient? Also, check on her symptoms from the metformin. Is she feeling better than last week/

## 2022-12-22 ENCOUNTER — Telehealth: Payer: Self-pay

## 2022-12-22 ENCOUNTER — Other Ambulatory Visit (HOSPITAL_COMMUNITY): Payer: Self-pay

## 2022-12-22 DIAGNOSIS — E1165 Type 2 diabetes mellitus with hyperglycemia: Secondary | ICD-10-CM

## 2022-12-22 NOTE — Telephone Encounter (Addendum)
A new Prior Authorization For Ozempic has now been submitted with a new Key and a new encounter. Follow ups will be made from there.

## 2022-12-22 NOTE — Telephone Encounter (Signed)
Called and spoke with patient, advised her to discontinue metformin.   PA team- Please rerun PA for ozempic with the new information that patient is intolerant to metformin.

## 2022-12-22 NOTE — Telephone Encounter (Addendum)
Pharmacy Patient Advocate Encounter   Received notification from Physician's Office that prior authorization for {Ozempicis required/requested.   Insurance verification completed.   The patient is insured through [facility] .   Per test claim: PA required; PA submitted to [facility] via CoverMyMeds Key/confirmation #/EOC (Key: [id number]) Status is pending

## 2022-12-22 NOTE — Telephone Encounter (Signed)
Called and spoke with patient, she stated her symptoms from using the metformin are still persisting. She is experiencing vertigo/dizziness, nausea, body aches. She states its to the point where she cannot function in the evenings to take care of her child. She is asking for another option.

## 2022-12-22 NOTE — Telephone Encounter (Signed)
Please thank her for the update.  Tell her to discontinue the metformin.  Can we try to rerun the prior authorization for Ozempic since she is intolerant to metformin?  Do I need to place a new order?

## 2022-12-23 NOTE — Telephone Encounter (Signed)
Pharmacy Patient Advocate Encounter  Received notification from [facility] that Prior Authorization for Ozempic (0.25 or 0.5 MG/DOSE) 2MG /3ML pen-injectors has been DENIED.  Full denial letter will be uploaded to the media tab. See denial reason below.  We received a request for OZEMPIC. Our records show that you were denied a reconsideration request of the requested drug on 12/22/2022. No new information was submitted with this request. Please have your doctor submit additional records to [place] for an appeal.  This decision does not involve any determination of medical judgment.  PA #/Case ID/Reference #: [id number]  Please be advised we currently do not have a Pharmacist to review denials, therefore you will need to process appeals accordingly as needed. Thanks for your support at this time. Contact for appeals are as follows: Phone: [phone], Fax: [phone]

## 2022-12-24 ENCOUNTER — Other Ambulatory Visit (HOSPITAL_COMMUNITY): Payer: Self-pay

## 2022-12-24 NOTE — Telephone Encounter (Signed)
Thank you :)

## 2022-12-24 NOTE — Telephone Encounter (Signed)
Hi,            A new Prior Auth was resubmitted with the updated information with the pt being unable to tolerate the metformin. However the P/A was still denied. At this time an Urgent  Appeal has been submitted on the pts behalf.  Thanks,

## 2022-12-24 NOTE — Telephone Encounter (Signed)
Thank you!  She had a terrible reaction to metformin. Would that qualify her for Ozempic now? She has diabetes.

## 2022-12-25 NOTE — Telephone Encounter (Signed)
Pharmacy Patient Advocate Encounter  Received notification from  [employer]  that Prior Authorization for Ozempic  has been APPROVED from 12-24-2022 to 12-24-2023

## 2022-12-28 DIAGNOSIS — G43009 Migraine without aura, not intractable, without status migrainosus: Secondary | ICD-10-CM

## 2022-12-28 DIAGNOSIS — E1165 Type 2 diabetes mellitus with hyperglycemia: Secondary | ICD-10-CM

## 2022-12-29 MED ORDER — ONDANSETRON 4 MG PO TBDP: 4.0000 mg | ORAL_TABLET | Freq: Three times a day (TID) | ORAL | 0 refills | Status: DC | PRN

## 2022-12-30 MED ORDER — OZEMPIC (0.25 OR 0.5 MG/DOSE) 2 MG/3ML ~~LOC~~ SOPN: PEN_INJECTOR | SUBCUTANEOUS | 0 refills | Status: DC

## 2022-12-30 NOTE — Telephone Encounter (Signed)
I do not see a prescription for ozempic in her chart

## 2022-12-30 NOTE — Telephone Encounter (Signed)
Please contact the pharmacy.  Was not the prior authorization approved for Ozempic?  She is saying it is $900 a month.

## 2022-12-30 NOTE — Telephone Encounter (Signed)
Patient contacted the office regarding medication ozempic, states pharmacy hasn't received anything. Please advise, thanks!

## 2022-12-31 ENCOUNTER — Other Ambulatory Visit (HOSPITAL_COMMUNITY): Payer: Self-pay

## 2022-12-31 NOTE — Telephone Encounter (Signed)
PA was approved in previous encounter, however,  pt has a high deductible that insurance is applying to Ozempic. Per test claim, $902.03 is being applied to periodic deductible

## 2022-12-31 NOTE — Telephone Encounter (Signed)
Are there any updates on this? Pt is asking.

## 2022-12-31 NOTE — Telephone Encounter (Signed)
Mardella Layman,  Do you mind taking a look at her Ozempic prescription? New onset type 2 diabetes. She is intolerant to metformin and has hives with sulfa drugs. Would like to get her on GLP 1 agonist if possible.  I could consider Rybelsus but not confident that the insurance will cover.   Thanks! Mayra Reel, NP-C

## 2023-01-26 MED ORDER — SUMATRIPTAN SUCCINATE 50 MG PO TABS: ORAL_TABLET | ORAL | 0 refills | Status: DC

## 2023-04-02 ENCOUNTER — Ambulatory Visit: Payer: 59 | Admitting: Primary Care

## 2023-05-26 ENCOUNTER — Ambulatory Visit (INDEPENDENT_AMBULATORY_CARE_PROVIDER_SITE_OTHER): Admitting: Family

## 2023-05-26 VITALS — BP 132/82 | HR 96 | Ht 61.0 in | Wt 240.2 lb

## 2023-05-26 DIAGNOSIS — H5711 Ocular pain, right eye: Secondary | ICD-10-CM | POA: Diagnosis not present

## 2023-05-26 DIAGNOSIS — H579 Unspecified disorder of eye and adnexa: Secondary | ICD-10-CM | POA: Diagnosis not present

## 2023-05-26 MED ORDER — VALACYCLOVIR HCL 1 G PO TABS: 1000.0000 mg | ORAL_TABLET | Freq: Three times a day (TID) | ORAL | 0 refills | Status: AC

## 2023-05-26 NOTE — Assessment & Plan Note (Addendum)
 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 Rx sent in for valtrex 1 g TID x 7 days.   If any sudden vision loss or change in vision go to ER.  Consider ofloxacin   Ok to continue with ibuprofen tylenol

## 2023-05-26 NOTE — Progress Notes (Signed)
 Established Patient Office Visit  Subjective:   Patient ID: Toni Salazar, female    DOB: January 29, 1986  Age: 38 y.o. MRN: [id number]  CC:  Chief Complaint  Patient presents with   Acute Visit    Possible shingles in eyes    HPI: Toni Salazar is a 38 y.o. female presenting on 05/26/2023 for Acute Visit (Possible shingles in eyes)  Three days ago noticed when she woke up that sh ehad swelling on her right eyelid. She thought maybe a stye, put a hot compress on it but yesterday when she woke up she noticed eye redness and more swelling under eye. She states she has noticing a few more bumps on her lower eye and the hot compresses seem to make it spread. 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  No change in vision.        ROS: Negative unless specifically indicated above in HPI.   Relevant past medical history reviewed and updated as indicated.   Allergies and medications reviewed and updated.   Current Outpatient Medications:    SUMAtriptan (IMITREX) 50 MG tablet, Take 1 tablet at migraine onset. May repeat in 2 hours if headache persists or recurs., Disp: 10 tablet, Rfl: 0   valACYclovir (VALTREX) 1000 MG tablet, Take 1 tablet (1,000 mg total) by mouth 3 (three) times daily for 7 days., Disp: 21 tablet, Rfl: 0  Allergies  Allergen Reactions   Amoxicillin Hives and Swelling    Has patient had a PCN reaction causing immediate rash, facial/tongue/throat swelling, SOB or lightheadedness with hypotension: No Has patient had a PCN reaction causing severe rash involving mucus membranes or skin necrosis: No Has patient had a PCN reaction that required hospitalization: No Has patient had a PCN reaction occurring within the last 10 years: Yes If all of the above answers are "NO", then may proceed with Cephalosporin use.    Sulfa Antibiotics Hives    Objective:   BP 132/82 (BP Location: Right Arm, Patient Position: Sitting, Cuff Size:  Large)   Pulse 96   Ht 5\' 1"  (1.549 m)   Wt 240 lb 3.2 oz (109 kg)   SpO2 95%   BMI 45.39 kg/m    Physical Exam Eyes:     General:        Right eye: Discharge present. Right eye hordeolum: dry crusting yellow to brown discharge lower lid.    Comments: Right lower lid with swelling and tenderness Small flat red lesion directly below right lower lid and small flat erythema patch on right outer temporal area near eye as well   Tenderness noted on manipulation of eye  Slight right upper lid swelling as well  Some slight pain with EOM        Assessment & Plan:  Acute right eye pain -     Ambulatory referral to Ophthalmology -     valACYclovir HCl; Take 1 tablet (1,000 mg total) by mouth 3 (three) times daily for 7 days.  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 Rx sent in for valtrex 1 g TID x 7 days.   If any sudden vision loss or change in vision go  to ER.  Consider ofloxacin   Ok to continue with ibuprofen tylenol   Orders: -     Ambulatory referral to Ophthalmology -     valACYclovir HCl; Take 1 tablet (1,000 mg total) by mouth 3 (three) times daily for 7 days.  Dispense: 21 tablet; Refill: 0     Follow up plan: Return for f/u PCP if no improvement in symptoms.  Mort Sawyers, FNP

## 2023-05-27 ENCOUNTER — Encounter: Payer: Self-pay | Admitting: Family

## 2023-06-09 ENCOUNTER — Ambulatory Visit: Admitting: Primary Care

## 2023-07-10 ENCOUNTER — Other Ambulatory Visit: Payer: Self-pay

## 2023-07-10 ENCOUNTER — Encounter: Payer: Self-pay | Admitting: Radiology

## 2023-07-10 ENCOUNTER — Emergency Department
Admission: EM | Admit: 2023-07-10 | Discharge: 2023-07-10 | Disposition: A | Discharge status: Home / Self Care | Attending: Emergency Medicine | Admitting: Emergency Medicine

## 2023-07-10 ENCOUNTER — Emergency Department

## 2023-07-10 DIAGNOSIS — R109 Unspecified abdominal pain: Secondary | ICD-10-CM

## 2023-07-10 DIAGNOSIS — R1031 Right lower quadrant pain: Secondary | ICD-10-CM | POA: Diagnosis present

## 2023-07-10 DIAGNOSIS — N132 Hydronephrosis with renal and ureteral calculous obstruction: Secondary | ICD-10-CM | POA: Insufficient documentation

## 2023-07-10 DIAGNOSIS — N200 Calculus of kidney: Secondary | ICD-10-CM

## 2023-07-10 LAB — BASIC METABOLIC PANEL WITH GFR
Anion gap: 8 (ref 5–15)
BUN: 11 mg/dL (ref 6–20)
CO2: 23 mmol/L (ref 22–32)
Calcium: 9.5 mg/dL (ref 8.9–10.3)
Chloride: 109 mmol/L (ref 98–111)
Creatinine, Ser: 0.87 mg/dL (ref 0.44–1.00)
GFR, Estimated: >60 mL/min (ref >60)
Glucose, Bld: 154 mg/dL — ABNORMAL HIGH (ref 70–99)
Potassium: 3.7 mmol/L (ref 3.5–5.1)
Sodium: 140 mmol/L (ref 135–145)

## 2023-07-10 LAB — CBC
HCT: 40.6 % (ref 36.0–46.0)
Hemoglobin: 13 g/dL (ref 12.0–15.0)
MCH: 28.1 pg (ref 26.0–34.0)
MCHC: 32 g/dL (ref 30.0–36.0)
MCV: 87.9 fL (ref 80.0–100.0)
Platelets: 368 10*3/uL (ref 150–400)
RBC: 4.62 MIL/uL (ref 3.87–5.11)
RDW: 16.7 % — ABNORMAL HIGH (ref 11.5–15.5)
WBC: 14.7 10*3/uL — ABNORMAL HIGH (ref 4.0–10.5)
nRBC: 0 % (ref 0.0–0.2)

## 2023-07-10 LAB — URINALYSIS, ROUTINE W REFLEX MICROSCOPIC
Bilirubin Urine: NEGATIVE
Glucose, UA: NEGATIVE mg/dL
Ketones, ur: NEGATIVE mg/dL
Leukocytes,Ua: NEGATIVE
Nitrite: NEGATIVE
Protein, ur: 30 mg/dL — AB
RBC / HPF: >50 RBC/hpf (ref 0–5)
Specific Gravity, Urine: 1.021 (ref 1.005–1.030)
pH: 6 (ref 5.0–8.0)

## 2023-07-10 LAB — POC URINE PREG, ED: Preg Test, Ur: NEGATIVE

## 2023-07-10 MED ORDER — TAMSULOSIN HCL 0.4 MG PO CAPS
0.8000 mg | ORAL_CAPSULE | Freq: Once | ORAL | Status: AC
  Administered 2023-07-10: 0.8 mg via ORAL
  Filled 2023-07-10: qty 2

## 2023-07-10 MED ORDER — HYDROMORPHONE HCL 1 MG/ML IJ SOLN
1.0000 mg | Freq: Once | INTRAMUSCULAR | Status: AC
  Administered 2023-07-10: 1 mg via INTRAVENOUS
  Filled 2023-07-10: qty 1

## 2023-07-10 MED ORDER — ONDANSETRON 8 MG PO TBDP: 8.0000 mg | ORAL_TABLET | Freq: Three times a day (TID) | ORAL | 0 refills | Status: DC | PRN

## 2023-07-10 MED ORDER — OXYCODONE-ACETAMINOPHEN 5-325 MG PO TABS: 1.0000 | ORAL_TABLET | ORAL | 0 refills | Status: DC | PRN

## 2023-07-10 MED ORDER — TAMSULOSIN HCL 0.4 MG PO CAPS: 0.4000 mg | ORAL_CAPSULE | Freq: Every day | ORAL | 0 refills | Status: AC

## 2023-07-10 MED ORDER — TAMSULOSIN HCL 0.4 MG PO CAPS: 0.4000 mg | ORAL_CAPSULE | Freq: Every day | ORAL | 0 refills | Status: DC

## 2023-07-10 MED ORDER — SODIUM CHLORIDE 0.9 % IV BOLUS
1000.0000 mL | Freq: Once | INTRAVENOUS | Status: AC
  Administered 2023-07-10: 1000 mL via INTRAVENOUS

## 2023-07-10 MED ORDER — KETOROLAC TROMETHAMINE 10 MG PO TABS: 10.0000 mg | ORAL_TABLET | Freq: Four times a day (QID) | ORAL | 0 refills | Status: DC | PRN

## 2023-07-10 MED ORDER — OXYCODONE-ACETAMINOPHEN 5-325 MG PO TABS
2.0000 | ORAL_TABLET | Freq: Once | ORAL | Status: AC
  Administered 2023-07-10: 2 via ORAL
  Filled 2023-07-10: qty 2

## 2023-07-10 MED ORDER — KETOROLAC TROMETHAMINE 30 MG/ML IJ SOLN
30.0000 mg | Freq: Once | INTRAMUSCULAR | Status: AC
  Administered 2023-07-10: 30 mg via INTRAVENOUS
  Filled 2023-07-10: qty 1

## 2023-07-10 MED ORDER — ONDANSETRON HCL 4 MG/2ML IJ SOLN
4.0000 mg | Freq: Once | INTRAMUSCULAR | Status: AC
  Administered 2023-07-10: 4 mg via INTRAVENOUS
  Filled 2023-07-10: qty 2

## 2023-07-10 NOTE — ED Triage Notes (Signed)
 States she has been trying to pass a kidney stone for the past 2 days and now believes that she is having a diverticulitis flare. She states she is peeing sediment out. She endorses that last week she was having bloody diarrhea last week. States it stopped about three days ago and is now having right lower quadrant pain.

## 2023-07-10 NOTE — ED Provider Notes (Signed)
 [facility] Provider Note   Event Date/Time   First MD Initiated Contact with Patient 07/10/23 1830     (approximate) History  Back Pain and Abdominal Pain  HPI Toni Salazar is a 38 y.o. female with a past medical history of kidney stones and chronic back pain who presents complaining of right flank pain that has been present over the last 3 days and progressively worsening.  Patient endorses difficulty with urination as well as intermittent diarrhea over the last 3 days.  Patient is concerned this may be a episode of diverticulitis as she has had diverticulitis in the past however that pain was on the left side.  Patient describes 10/10, sharp/burning right flank pain that radiates into the right lower quadrant ROS: Patient currently denies any vision changes, tinnitus, difficulty speaking, facial droop, sore throat, chest pain, shortness of breath, nausea/vomiting/diarrhea, dysuria, or weakness/numbness/paresthesias in any extremity   Physical Exam  Triage Vital Signs: ED Triage Vitals  Encounter Vitals Group     BP 07/10/23 1742 (!) 181/114     Systolic BP Percentile --      Diastolic BP Percentile --      Pulse Rate 07/10/23 1742 95     Resp 07/10/23 1742 19     Temp 07/10/23 1742 97.8 F (36.6 C)     Temp Source 07/10/23 1742 Oral     SpO2 07/10/23 1742 97 %     Weight 07/10/23 1739 240 lb (108.9 kg)     Height 07/10/23 1739 5\' 1"  (1.549 m)     Head Circumference --      Peak Flow --      Pain Score --      Pain Loc --      Pain Education --      Exclude from Growth Chart --    Most recent vital signs: Vitals:   07/10/23 2146 07/10/23 2146  BP:  124/70  Pulse:  78  Resp:  16  Temp: 97.6 F (36.4 C) 97.6 F (36.4 C)  SpO2:  99%   General: Awake, oriented x4. CV:  Good peripheral perfusion.  Resp:  Normal effort.  Abd:  No distention.  Other:  Middle-aged obese Caucasian female on stretcher in all fours in moderate distress secondary  to pain ED Results / Procedures / Treatments  Labs (all labs ordered are listed, but only abnormal results are displayed) Labs Reviewed  URINALYSIS, ROUTINE W REFLEX MICROSCOPIC - Abnormal; Notable for the following components:      Result Value   Color, Urine YELLOW (*)    APPearance HAZY (*)    Hgb urine dipstick LARGE (*)    Protein, ur 30 (*)    Bacteria, UA RARE (*)    All other components within normal limits  BASIC METABOLIC PANEL WITH GFR - Abnormal; Notable for the following components:   Glucose, Bld 154 (*)    All other components within normal limits  CBC - Abnormal; Notable for the following components:   WBC 14.7 (*)    RDW 16.7 (*)    All other components within normal limits  POC URINE PREG, ED   RADIOLOGY ED MD interpretation: CT of the abdomen pelvis without IV contrast independently interpreted and shows 5 mm proximal right ureteral calculus at the L4 level -Agree with radiology assessment Official radiology report(s): CT Renal Stone Study Result Date: 07/10/2023 EXAM: CT ABDOMEN AND PELVIS WITHOUT CONTRAST 07/10/2023 07:50:19 PM TECHNIQUE: CT of the abdomen  and pelvis was performed Multiplanar reformatted images are provided for review. Automated exposure control, iterative reconstruction, and/or weight based adjustment of the mA/kV was utilized to reduce the radiation dose to as low as reasonably achievable. COMPARISON: 12/21/2020 CLINICAL HISTORY: Abdominal/flank pain, stone suspected. Per ED notes; states she has been trying to pass a kidney stone for the past 2 days and now believes that she is having a diverticulitis flare. She states she is peeing sediment out. She endorses that last week she was having bloody diarrhea. States it stopped about three days ago and is now having right lower quadrant pain. FINDINGS: LOWER CHEST: No acute abnormality. HEPATOBILIARY: Mild hepatic steatosis. Gallbladder is unremarkable. No biliary ductal dilatation. SPLEEN: No acute  abnormality. PANCREAS: No acute abnormality. ADRENAL GLANDS: No acute abnormality. KIDNEYS, URETERS: 8 mm nonobstructing right upper pole renal calculus (image 43). Mild right hydronephrosis with 5 mm proximal right ureteral calculus at the L4 level (coronal image 60). GI AND BOWEL: Stomach demonstrates no acute abnormality. There is no evidence of bowel obstruction. Normal appendix (image 51). PELVIS: Urinary bladder is unremarkable. PERITONEUM AND RETROPERITONEUM: No evidence of free fluid or free air. LYMPH NODES: No evidence of lymphadenopathy. BONES AND SOFT TISSUES: No acute osseous abnormality. No focal soft tissue abnormality. Incidental adrenal and/or renal findings do not require follow up imaging. IMPRESSION: 1. 5 mm proximal right ureteral calculus at the L4 level. Associated mild right hydronephrosis. 2. Additional 8 mm nonobstructing right upper pole renal calculus. Electronically signed by: Zadie Herter MD 07/10/2023 09:40 PM EDT RP Workstation: [id number]   PROCEDURES: Critical Care performed: No Procedures MEDICATIONS ORDERED IN ED: Medications  ketorolac  (TORADOL ) 30 MG/ML injection 30 mg (30 mg Intravenous Given 07/10/23 1923)  sodium chloride  0.9 % bolus 1,000 mL (0 mLs Intravenous Stopped 07/10/23 2153)  ondansetron  (ZOFRAN ) injection 4 mg (4 mg Intravenous Given 07/10/23 1923)  HYDROmorphone  (DILAUDID ) injection 1 mg (1 mg Intravenous Given 07/10/23 1922)  tamsulosin  (FLOMAX ) capsule 0.8 mg (0.8 mg Oral Given 07/10/23 2042)  oxyCODONE -acetaminophen  (PERCOCET/ROXICET) 5-325 MG per tablet 2 tablet (2 tablets Oral Given 07/10/23 2110)   IMPRESSION / MDM / ASSESSMENT AND PLAN / ED COURSE  I reviewed the triage vital signs and the nursing notes.                             The patient is on the cardiac monitor to evaluate for evidence of arrhythmia and/or significant heart rate changes. Patient's presentation is most consistent with acute presentation with potential threat to life  or bodily function. Patient presents for severe flank pain. Presentation most consistent with Renal Colic from a Non-infected Kidney Stone. Given History and Exam I have lower suspicion for atypical appendicitis, genital torsion, acute cholecystitis, AAA, Aortic Dissection, Serious Bacterial Illness or other emergent intraabdominal pathology.  Workup: CBC, BMP, CT Abd/Pelvis noncontrast, UA, reassess Findings: 5 mm right proximal ureteral stone Reassesment: Patient tolerating PO and pain controlled Disposition:  Discharge. Strict return precautions for infected stone or PO intolerance discussed.   FINAL CLINICAL IMPRESSION(S) / ED DIAGNOSES   Final diagnoses:  Kidney stone on right side  Acute right flank pain   Rx / DC Orders   ED Discharge Orders          Ordered    oxyCODONE -acetaminophen  (PERCOCET) 5-325 MG tablet  Every 4 hours PRN        07/10/23 2238    ondansetron  (ZOFRAN -ODT) 8 MG disintegrating tablet  Every 8 hours PRN        07/10/23 2238    ketorolac  (TORADOL ) 10 MG tablet  Every 6 hours PRN       Note to Pharmacy: Patient given an IM/IV loading dose in emergency department   07/10/23 2238    tamsulosin  (FLOMAX ) 0.4 MG CAPS capsule  Daily        07/10/23 2238           Note:  This document was prepared using Dragon voice recognition software and may include unintentional dictation errors.   Charleen Conn, MD 07/10/23 [phone]

## 2023-07-15 ENCOUNTER — Telehealth: Payer: Self-pay | Admitting: Emergency Medicine

## 2023-07-15 NOTE — Telephone Encounter (Signed)
 Send message to Lasara at [facility] and she contacted the patient to schedule for follow up and the patient stated that she can't afford a follow up appt, she is a single mom and her out of pocket right now is over $8,000.  Patient advised to contact the office or go to the ER if needed.

## 2024-04-02 ENCOUNTER — Observation Stay: Admitting: Anesthesiology

## 2024-04-02 ENCOUNTER — Observation Stay

## 2024-04-02 ENCOUNTER — Other Ambulatory Visit: Payer: Self-pay

## 2024-04-02 ENCOUNTER — Encounter: Admission: EM | Disposition: A | Payer: Self-pay | Source: Home / Self Care | Attending: Emergency Medicine

## 2024-04-02 ENCOUNTER — Observation Stay
Admission: EM | Admit: 2024-04-02 | Discharge: 2024-04-03 | Disposition: A | Attending: Internal Medicine | Admitting: Internal Medicine

## 2024-04-02 ENCOUNTER — Emergency Department

## 2024-04-02 DIAGNOSIS — Z9889 Other specified postprocedural states: Secondary | ICD-10-CM

## 2024-04-02 DIAGNOSIS — N132 Hydronephrosis with renal and ureteral calculous obstruction: Secondary | ICD-10-CM | POA: Diagnosis not present

## 2024-04-02 DIAGNOSIS — F17210 Nicotine dependence, cigarettes, uncomplicated: Secondary | ICD-10-CM | POA: Insufficient documentation

## 2024-04-02 DIAGNOSIS — D72829 Elevated white blood cell count, unspecified: Secondary | ICD-10-CM | POA: Diagnosis not present

## 2024-04-02 DIAGNOSIS — R10A1 Flank pain, right side: Secondary | ICD-10-CM | POA: Diagnosis present

## 2024-04-02 DIAGNOSIS — J45909 Unspecified asthma, uncomplicated: Secondary | ICD-10-CM | POA: Diagnosis not present

## 2024-04-02 DIAGNOSIS — N1330 Unspecified hydronephrosis: Secondary | ICD-10-CM | POA: Diagnosis present

## 2024-04-02 DIAGNOSIS — R1031 Right lower quadrant pain: Principal | ICD-10-CM

## 2024-04-02 HISTORY — PX: CYSTOSCOPY/URETEROSCOPY/HOLMIUM LASER/STENT PLACEMENT: SHX6546

## 2024-04-02 LAB — URINALYSIS, ROUTINE W REFLEX MICROSCOPIC
Bacteria, UA: NONE SEEN
Bilirubin Urine: NEGATIVE
Glucose, UA: NEGATIVE mg/dL
Ketones, ur: NEGATIVE mg/dL
Leukocytes,Ua: NEGATIVE
Nitrite: NEGATIVE
Protein, ur: NEGATIVE mg/dL
Specific Gravity, Urine: 1.016 (ref 1.005–1.030)
pH: 7 (ref 5.0–8.0)

## 2024-04-02 LAB — COMPREHENSIVE METABOLIC PANEL WITH GFR
ALT: 25 U/L (ref 0–44)
AST: 27 U/L (ref 15–41)
Albumin: 4.2 g/dL (ref 3.5–5.0)
Alkaline Phosphatase: 103 U/L (ref 38–126)
Anion gap: 12 (ref 5–15)
BUN: 16 mg/dL (ref 6–20)
CO2: 23 mmol/L (ref 22–32)
Calcium: 9.3 mg/dL (ref 8.9–10.3)
Chloride: 105 mmol/L (ref 98–111)
Creatinine, Ser: 0.96 mg/dL (ref 0.44–1.00)
GFR, Estimated: >60 mL/min
Glucose, Bld: 148 mg/dL — ABNORMAL HIGH (ref 70–99)
Potassium: 4 mmol/L (ref 3.5–5.1)
Sodium: 140 mmol/L (ref 135–145)
Total Bilirubin: 0.4 mg/dL (ref 0.0–1.2)
Total Protein: 7.4 g/dL (ref 6.5–8.1)

## 2024-04-02 LAB — CBC
HCT: 39 % (ref 36.0–46.0)
HCT: 35.2 % — ABNORMAL LOW (ref 36.0–46.0)
Hemoglobin: 12.2 g/dL (ref 12.0–15.0)
Hemoglobin: 10.9 g/dL — ABNORMAL LOW (ref 12.0–15.0)
MCH: 26.9 pg (ref 26.0–34.0)
MCH: 26.8 pg (ref 26.0–34.0)
MCHC: 31.3 g/dL (ref 30.0–36.0)
MCHC: 31 g/dL (ref 30.0–36.0)
MCV: 85.9 fL (ref 80.0–100.0)
MCV: 86.5 fL (ref 80.0–100.0)
Platelets: 298 K/uL (ref 150–400)
Platelets: 262 K/uL (ref 150–400)
RBC: 4.54 MIL/uL (ref 3.87–5.11)
RBC: 4.07 MIL/uL (ref 3.87–5.11)
RDW: 15.8 % — ABNORMAL HIGH (ref 11.5–15.5)
RDW: 15.9 % — ABNORMAL HIGH (ref 11.5–15.5)
WBC: 14.4 K/uL — ABNORMAL HIGH (ref 4.0–10.5)
WBC: 11.5 K/uL — ABNORMAL HIGH (ref 4.0–10.5)
nRBC: 0 % (ref 0.0–0.2)
nRBC: 0 % (ref 0.0–0.2)

## 2024-04-02 LAB — CREATININE, SERUM
Creatinine, Ser: 0.84 mg/dL (ref 0.44–1.00)
GFR, Estimated: >60 mL/min

## 2024-04-02 LAB — LIPASE, BLOOD: Lipase: 25 U/L (ref 11–51)

## 2024-04-02 LAB — PREGNANCY, URINE: Preg Test, Ur: NEGATIVE

## 2024-04-02 MED ORDER — ENOXAPARIN SODIUM 40 MG/0.4ML IJ SOSY: 40.0000 mg | PREFILLED_SYRINGE | INTRAMUSCULAR | Status: DC

## 2024-04-02 MED ORDER — ACETAMINOPHEN 650 MG RE SUPP: 650.0000 mg | Freq: Four times a day (QID) | RECTAL | Status: DC | PRN

## 2024-04-02 MED ORDER — DEXMEDETOMIDINE HCL IN NACL 80 MCG/20ML IV SOLN
INTRAVENOUS | Status: DC | PRN
  Administered 2024-04-02 (×2): 4 ug via INTRAVENOUS
  Administered 2024-04-02: 8 ug via INTRAVENOUS
  Administered 2024-04-02: 4 ug via INTRAVENOUS

## 2024-04-02 MED ORDER — KETOROLAC TROMETHAMINE 15 MG/ML IJ SOLN
15.0000 mg | Freq: Once | INTRAMUSCULAR | Status: AC
  Administered 2024-04-02: 15 mg via INTRAVENOUS
  Filled 2024-04-02: qty 1

## 2024-04-02 MED ORDER — SODIUM CHLORIDE 0.9 % IV SOLN: INTRAVENOUS | Status: AC

## 2024-04-02 MED ORDER — ONDANSETRON HCL 4 MG PO TABS: 4.0000 mg | ORAL_TABLET | Freq: Four times a day (QID) | ORAL | Status: DC | PRN

## 2024-04-02 MED ORDER — HYDROMORPHONE HCL 1 MG/ML IJ SOLN
0.5000 mg | Freq: Once | INTRAMUSCULAR | Status: AC
  Administered 2024-04-02: 0.5 mg via INTRAVENOUS
  Filled 2024-04-02: qty 0.5

## 2024-04-02 MED ORDER — LACTATED RINGERS IV SOLN: INTRAVENOUS | Status: DC | PRN

## 2024-04-02 MED ORDER — 0.9 % SODIUM CHLORIDE (POUR BTL) OPTIME
TOPICAL | Status: DC | PRN
  Administered 2024-04-02: 500 mL

## 2024-04-02 MED ORDER — DEXAMETHASONE SOD PHOSPHATE PF 10 MG/ML IJ SOLN
INTRAMUSCULAR | Status: AC
  Filled 2024-04-02: qty 1

## 2024-04-02 MED ORDER — OXYCODONE HCL 5 MG/5ML PO SOLN: 5.0000 mg | Freq: Once | ORAL | Status: DC | PRN

## 2024-04-02 MED ORDER — ROCURONIUM BROMIDE 10 MG/ML (PF) SYRINGE
PREFILLED_SYRINGE | INTRAVENOUS | Status: AC
  Filled 2024-04-02: qty 10

## 2024-04-02 MED ORDER — FENTANYL CITRATE (PF) 100 MCG/2ML IJ SOLN
INTRAMUSCULAR | Status: AC
  Filled 2024-04-02: qty 2

## 2024-04-02 MED ORDER — MORPHINE SULFATE (PF) 2 MG/ML IV SOLN
2.0000 mg | INTRAVENOUS | Status: DC | PRN
  Administered 2024-04-02 (×2): 2 mg via INTRAVENOUS
  Filled 2024-04-02 (×2): qty 1

## 2024-04-02 MED ORDER — FENTANYL CITRATE (PF) 100 MCG/2ML IJ SOLN
INTRAMUSCULAR | Status: DC | PRN
  Administered 2024-04-02 (×2): 50 ug via INTRAVENOUS

## 2024-04-02 MED ORDER — POLYETHYLENE GLYCOL 3350 17 G PO PACK
17.0000 g | PACK | Freq: Every day | ORAL | Status: DC | PRN
  Filled 2024-04-02: qty 1

## 2024-04-02 MED ORDER — IOHEXOL 300 MG/ML  SOLN
100.0000 mL | Freq: Once | INTRAMUSCULAR | Status: AC | PRN
  Administered 2024-04-02: 100 mL via INTRAVENOUS

## 2024-04-02 MED ORDER — MIDAZOLAM HCL 2 MG/2ML IJ SOLN
INTRAMUSCULAR | Status: AC
  Filled 2024-04-02: qty 2

## 2024-04-02 MED ORDER — EPHEDRINE SULFATE-NACL 50-0.9 MG/10ML-% IV SOSY
PREFILLED_SYRINGE | INTRAVENOUS | Status: DC | PRN
  Administered 2024-04-02: 5 mg via INTRAVENOUS
  Administered 2024-04-02: 10 mg via INTRAVENOUS

## 2024-04-02 MED ORDER — MORPHINE SULFATE (PF) 4 MG/ML IV SOLN
4.0000 mg | Freq: Once | INTRAVENOUS | Status: AC
  Administered 2024-04-02: 4 mg via INTRAVENOUS
  Filled 2024-04-02: qty 1

## 2024-04-02 MED ORDER — OXYCODONE-ACETAMINOPHEN 5-325 MG PO TABS: 1.0000 | ORAL_TABLET | ORAL | 0 refills | Status: DC | PRN

## 2024-04-02 MED ORDER — DEXAMETHASONE SOD PHOSPHATE PF 10 MG/ML IJ SOLN
INTRAMUSCULAR | Status: DC | PRN
  Administered 2024-04-02: 10 mg via INTRAVENOUS

## 2024-04-02 MED ORDER — PROPOFOL 10 MG/ML IV BOLUS
INTRAVENOUS | Status: AC
  Filled 2024-04-02: qty 20

## 2024-04-02 MED ORDER — LIDOCAINE HCL (PF) 2 % IJ SOLN
INTRAMUSCULAR | Status: AC
  Filled 2024-04-02: qty 5

## 2024-04-02 MED ORDER — LIDOCAINE HCL (CARDIAC) PF 100 MG/5ML IV SOSY
PREFILLED_SYRINGE | INTRAVENOUS | Status: DC | PRN
  Administered 2024-04-02: 100 mg via INTRAVENOUS

## 2024-04-02 MED ORDER — PHENYLEPHRINE 80 MCG/ML (10ML) SYRINGE FOR IV PUSH (FOR BLOOD PRESSURE SUPPORT)
PREFILLED_SYRINGE | INTRAVENOUS | Status: DC | PRN
  Administered 2024-04-02 (×7): 160 ug via INTRAVENOUS

## 2024-04-02 MED ORDER — SODIUM CHLORIDE 0.9 % IV BOLUS
1000.0000 mL | Freq: Once | INTRAVENOUS | Status: AC
  Administered 2024-04-02: 1000 mL via INTRAVENOUS

## 2024-04-02 MED ORDER — ONDANSETRON HCL 4 MG/2ML IJ SOLN
INTRAMUSCULAR | Status: AC
  Filled 2024-04-02: qty 2

## 2024-04-02 MED ORDER — CIPROFLOXACIN IN D5W 400 MG/200ML IV SOLN: 400.0000 mg | INTRAVENOUS | Status: DC

## 2024-04-02 MED ORDER — ONDANSETRON HCL 4 MG/2ML IJ SOLN
INTRAMUSCULAR | Status: DC | PRN
  Administered 2024-04-02: 4 mg via INTRAVENOUS

## 2024-04-02 MED ORDER — PROPOFOL 10 MG/ML IV BOLUS
INTRAVENOUS | Status: DC | PRN
  Administered 2024-04-02: 200 mg via INTRAVENOUS

## 2024-04-02 MED ORDER — ACETAMINOPHEN 10 MG/ML IV SOLN
INTRAVENOUS | Status: AC
  Filled 2024-04-02: qty 100

## 2024-04-02 MED ORDER — CIPROFLOXACIN IN D5W 400 MG/200ML IV SOLN
INTRAVENOUS | Status: AC
  Filled 2024-04-02: qty 200

## 2024-04-02 MED ORDER — IOHEXOL 180 MG/ML  SOLN
INTRAMUSCULAR | Status: DC | PRN
  Administered 2024-04-02: 15 mL

## 2024-04-02 MED ORDER — FENTANYL CITRATE (PF) 100 MCG/2ML IJ SOLN
25.0000 ug | INTRAMUSCULAR | Status: DC | PRN
  Administered 2024-04-02 (×2): 25 ug via INTRAVENOUS

## 2024-04-02 MED ORDER — OXYCODONE HCL 5 MG PO TABS
5.0000 mg | ORAL_TABLET | ORAL | Status: DC | PRN
  Administered 2024-04-02: 5 mg via ORAL
  Filled 2024-04-02: qty 1

## 2024-04-02 MED ORDER — ROCURONIUM BROMIDE 100 MG/10ML IV SOLN
INTRAVENOUS | Status: DC | PRN
  Administered 2024-04-02: 100 mg via INTRAVENOUS

## 2024-04-02 MED ORDER — ONDANSETRON HCL 4 MG/2ML IJ SOLN
4.0000 mg | Freq: Four times a day (QID) | INTRAMUSCULAR | Status: DC | PRN
  Administered 2024-04-02 (×2): 4 mg via INTRAVENOUS
  Filled 2024-04-02 (×2): qty 2

## 2024-04-02 MED ORDER — ONDANSETRON HCL 4 MG/2ML IJ SOLN
4.0000 mg | Freq: Once | INTRAMUSCULAR | Status: AC
  Administered 2024-04-02: 4 mg via INTRAVENOUS
  Filled 2024-04-02: qty 2

## 2024-04-02 MED ORDER — ONDANSETRON HCL 4 MG PO TABS: 4.0000 mg | ORAL_TABLET | Freq: Four times a day (QID) | ORAL | 1 refills | Status: DC | PRN

## 2024-04-02 MED ORDER — ACETAMINOPHEN 325 MG PO TABS
650.0000 mg | ORAL_TABLET | Freq: Four times a day (QID) | ORAL | Status: DC | PRN
  Administered 2024-04-03: 650 mg via ORAL
  Filled 2024-04-02: qty 2

## 2024-04-02 MED ORDER — TAMSULOSIN HCL 0.4 MG PO CAPS
0.4000 mg | ORAL_CAPSULE | Freq: Once | ORAL | Status: AC
  Administered 2024-04-02: 0.4 mg via ORAL
  Filled 2024-04-02: qty 1

## 2024-04-02 MED ORDER — MIDAZOLAM HCL (PF) 2 MG/2ML IJ SOLN
INTRAMUSCULAR | Status: DC | PRN
  Administered 2024-04-02: 2 mg via INTRAVENOUS

## 2024-04-02 MED ORDER — SODIUM CHLORIDE 0.9 % IR SOLN
Status: DC | PRN
  Administered 2024-04-02: 3000 mL via INTRAVESICAL

## 2024-04-02 MED ORDER — OXYCODONE HCL 5 MG PO TABS: 5.0000 mg | ORAL_TABLET | Freq: Once | ORAL | Status: DC | PRN

## 2024-04-02 MED ORDER — ENOXAPARIN SODIUM 60 MG/0.6ML IJ SOSY
0.5000 mg/kg | PREFILLED_SYRINGE | INTRAMUSCULAR | Status: DC
  Filled 2024-04-02: qty 0.6

## 2024-04-02 MED ORDER — ACETAMINOPHEN 10 MG/ML IV SOLN
INTRAVENOUS | Status: DC | PRN
  Administered 2024-04-02: 1000 mg via INTRAVENOUS

## 2024-04-02 MED ORDER — SUGAMMADEX SODIUM 200 MG/2ML IV SOLN
INTRAVENOUS | Status: DC | PRN
  Administered 2024-04-02: 430 mg via INTRAVENOUS

## 2024-04-02 NOTE — Discharge Summary (Incomplete)
 " Physician Discharge Summary   Patient: Toni Salazar MRN: [id number] DOB: January 05, 1986  Admit date:     04/02/2024  Discharge date: 04/02/24  Discharge Physician: Toni Salazar   PCP: Toni Comer POUR, NP   Recommendations at discharge:  {Tip this will not be part of the note when signed- Example include specific recommendations for outpatient follow-up, pending tests to follow-up on. (Optional):26781} Follow-up with urologist as scheduled.  Discharge Diagnoses: Principal Problem:   Hydronephrosis  Resolved Problems:   * No resolved hospital problems. *  Hospital Course: HPI: Toni Salazar is a pleasant 39 y.o. female with medical history significant for urolithiasis with prior endoscopic management in 2019 and 21 who presented to ED complaining of right flank pain and nausea without fever or voiding symptoms.  Patient stated that her pain was 9/10 in intensity, sharp, associate with right back pain.  She denied any fever, chills, dysuria.   ED Course: Upon arrival to the ED, patient is found to have positive UA which showed RBCs and CT scan of the abdomen demonstrated 1 cm right UPJ stone with bilateral renal stones with the largest 5 mm in the right kidney.  She has a very mild leukocytosis.  Urology was consulted who will come and see the patient.    Assessment and Plan: Patient underwent cystoscopy with right ureteroscopy with laser application and stent insertion.  Patient was requesting to go home.  Toni Salazar advised that patient could be discharged home with follow-up with urology as scheduled.  [name] once stable.    {Tip this will not be part of the note when signed Body mass index is 44.78 kg/m. , ,  (Optional):26781}  {(NOTE) Pain control PDMP Statment (Optional):26782} Consultants: Urology Procedures performed: Cystoscopy with right ureteroscopy with laser application and stent insertion Disposition: Home Diet recommendation:  Regular  diet DISCHARGE MEDICATION: Allergies as of 04/02/2024       Reactions   Amoxicillin Hives, Swelling   Has patient had a PCN reaction causing immediate rash, facial/tongue/throat swelling, SOB or lightheadedness with hypotension: No Has patient had a PCN reaction causing severe rash involving mucus membranes or skin necrosis: No Has patient had a PCN reaction that required hospitalization: No Has patient had a PCN reaction occurring within the last 10 years: Yes If all of the above answers are NO, then may proceed with Cephalosporin use.   Sulfa Antibiotics Hives        Medication List     TAKE these medications    ibuprofen 200 MG tablet Commonly known as: ADVIL Take 200 mg by mouth every 6 (six) hours as needed for fever, headache or mild pain (pain score 1-3).   ketorolac  10 MG tablet Commonly known as: TORADOL  Take 1 tablet (10 mg total) by mouth every 6 (six) hours as needed.   ondansetron  4 MG tablet Commonly known as: ZOFRAN  Take 1 tablet (4 mg total) by mouth 4 (four) times daily as needed for nausea or vomiting.   ondansetron  8 MG disintegrating tablet Commonly known as: ZOFRAN -ODT Take 1 tablet (8 mg total) by mouth every 8 (eight) hours as needed for nausea or vomiting.   oxyCODONE -acetaminophen  5-325 MG tablet Commonly known as: Percocet Take 1 tablet by mouth every 4 (four) hours as needed for severe pain (pain score 7-10).        Follow-up Information     [facility] [place] Follow up.   Specialty: Urology Why: Please call to arrange  a follow up vist for 1 - 2 weeks from now. Contact information: [address] [place], [address] [place]  [address] [phone]               Discharge Exam: Toni Salazar   04/02/24 0436  Weight: 107.5 kg   Unremarkable exam  Condition at discharge: good  The results of significant diagnostics from this hospitalization (including imaging, microbiology, ancillary and laboratory)  are listed below for reference.   Imaging Studies: DG OR UROLOGY CYSTO IMAGE ([facility] ONLY) Result Date: 04/02/2024 There is no interpretation for this exam.  This order is for images obtained during a surgical procedure.  Please See Surgeries Tab for more information regarding the procedure.   CT ABDOMEN PELVIS W CONTRAST Result Date: 04/02/2024 EXAM: CT ABDOMEN AND PELVIS WITH CONTRAST 04/02/2024 07:57:42 AM TECHNIQUE: CT of the abdomen and pelvis was performed with the administration of 100 mL of iohexol  (OMNIPAQUE ) 300 MG/ML solution. Multiplanar reformatted images are provided for review. Automated exposure control, iterative reconstruction, and/or weight-based adjustment of the mA/kV was utilized to reduce the radiation dose to as low as reasonably achievable. COMPARISON: 07/10/2023 CLINICAL HISTORY: Right sided flank pain, radiation to RLQ abd. FINDINGS: LOWER CHEST: No acute abnormality. LIVER: Subjective hepatic steatosis. GALLBLADDER AND BILE DUCTS: Gallbladder is unremarkable. No biliary ductal dilatation. SPLEEN: The spleen is within normal limits in size and appearance. PANCREAS: The pancreas is normal in size and contour without focal lesion or ductal dilatation. ADRENAL GLANDS: Normal size and morphology bilaterally. No nodule, thickening, or hemorrhage. No periadrenal stranding. KIDNEYS, URETERS AND BLADDER: Punctate stone noted in the upper pole of the left kidney. Several right kidney stones are noted. The largest is in the upper pole measuring 5 mm, image 32/2. Cyst within the upper pole of the right kidney compatible with a Bosniak class 1 lesion, measures 1.4 cm. Per consensus, no follow-up is needed for simple Bosniak type 1 and 2 renal cysts, unless the patient has a malignancy history or risk factors. There is right-sided hydronephrosis. At the right UPJ, there is a stone measuring 1.1 cm by 0.9 cm. Urinary bladder is unremarkable. GI AND BOWEL: Stomach appears normal. Injured appendix.  The sigmoid colon demonstrates diverticulosis without evidence of diverticulitis. There is no bowel wall thickening, pericolonic stranding, abscess, or free air. There is no bowel obstruction. PERITONEUM AND RETROPERITONEUM: No ascites or focal fluid collections. No free air. VASCULATURE: Aorta is normal in caliber. LYMPH NODES: No lymphadenopathy. REPRODUCTIVE ORGANS: The uterus and adnexal structures are unremarkable. BONES AND SOFT TISSUES: Tiny fat-containing umbilical hernia. Asymmetric increased sclerosis within the right iliac bone measures 2.8 x 1.8 cm, image 59/2. Unchanged from 12/21/2020. IMPRESSION: 1. Right-sided hydronephrosis due to a 1.1 x 0.9 cm stone at the right UPJ. 2. Additional bilateral nonobstructing renal calculi, largest 5 mm in the right upper pole. Electronically signed by: Waddell Calk MD 04/02/2024 08:10 AM EST RP Workstation: [id number]    Microbiology: Results for orders placed or performed during the hospital encounter of 12/21/20  Blood culture (routine single)     Status: None   Collection Time: 12/21/20  3:51 PM   Specimen: BLOOD  Result Value Ref Range Status   Specimen Description BLOOD LEFT ANTECUBITAL  Final   Special Requests   Final    BOTTLES DRAWN AEROBIC AND ANAEROBIC Blood Culture adequate volume   Culture   Final    NO GROWTH 5 DAYS Performed at [facility], [address], [place], [place]  [address]    Report Status 12/26/2020 FINAL  Final  Urine Culture     Status: Abnormal   Collection Time: 12/21/20  3:51 PM   Specimen: In/Out Cath Urine  Result Value Ref Range Status   Specimen Description   Final    IN/OUT CATH URINE Performed at [facility], [address] [place], [place] [address]    Special Requests   Final    NONE Performed at [facility], [address] [place], [place] [address]    Culture (A)  Final    60,000 COLONIES/mL MULTIPLE SPECIES PRESENT, SUGGEST RECOLLECTION   Report Status  12/23/2020 FINAL  Final    Labs: CBC: Recent Labs  Lab 04/02/24 0447 04/02/24 1043  WBC 14.4* 11.5*  HGB 12.2 10.9*  HCT 39.0 35.2*  MCV 85.9 86.5  PLT 298 262   Basic Metabolic Panel: Recent Labs  Lab 04/02/24 0447 04/02/24 1043  NA 140  --   K 4.0  --   CL 105  --   CO2 23  --   GLUCOSE 148*  --   BUN 16  --   CREATININE 0.96 0.84  CALCIUM 9.3  --    Liver Function Tests: Recent Labs  Lab 04/02/24 0447  AST 27  ALT 25  ALKPHOS 103  BILITOT 0.4  PROT 7.4  ALBUMIN 4.2   CBG: No results for input(s): GLUCAP in the last 168 hours.  Discharge time spent: greater than 30 minutes.  Signed: Talyssa Gibas, MD [facility] 04/02/2024 "

## 2024-04-02 NOTE — Progress Notes (Signed)
 PHARMACIST - PHYSICIAN COMMUNICATION  CONCERNING:  Enoxaparin  (Lovenox ) for DVT Prophylaxis    RECOMMENDATION: Patient was prescribed enoxaprin 40mg  q24 hours for VTE prophylaxis.   Filed Weights   04/02/24 0436  Weight: 107.5 kg (237 lb)    Body mass index is 44.78 kg/m.  Estimated Creatinine Clearance: 90.8 mL/min (by C-G formula based on SCr of 0.96 mg/dL).   Based on [facility] policy patient is candidate for enoxaparin  0.5mg /kg TBW SQ every 24 hours based on BMI being >30.   DESCRIPTION: Pharmacy has adjusted enoxaparin  dose per [facility] policy.  Patient is now receiving enoxaparin  55 mg every 24 hours    Estill CHRISTELLA Lutes, PharmD, BCPS Clinical Pharmacist 04/02/2024 10:09 AM

## 2024-04-02 NOTE — Consult Note (Addendum)
 " Subjective: 1. Right lower quadrant abdominal pain      Consult requested by Dr. Ester Sharps.  Toni Salazar is a 39 yo female with a history of recurrent urolithiasis with prior endoscopic management in 2019 and 2021.  She presents today with a 3 day history of right flank pain and nausea without fever or voiding symptoms.  Her UA has a few RBC's and a CT demonstrates a 1cm right UPJ stone with bilateral renal stones with the largest 5mm in the right kidney.   She has a very mild leukocytosis and chronic stable mild hyperglycemia.  There was difficulty managing her pain initially but she is more comfortable now.    ROS:  Review of Systems  Genitourinary:  Positive for flank pain.  All other systems reviewed and are negative.   Allergies[1]  Past Medical History:  Diagnosis Date   Acute diverticulitis    GAD (generalized anxiety disorder)    History of kidney stones    Kidney stone    PTSD (post-traumatic stress disorder)    Right ureteral stone 04/29/2017    Past Surgical History:  Procedure Laterality Date   CESAREAN SECTION     CYSTOSCOPY W/ RETROGRADES  12/14/2019   Procedure: CYSTOSCOPY WITH RETROGRADE PYELOGRAM;  Surgeon: Francisca Redell BROCKS, MD;  Location: [facility];  Service: Urology;;   CYSTOSCOPY WITH STENT PLACEMENT Right 04/29/2017   Procedure: CYSTOSCOPY WITH STENT PLACEMENT;  Surgeon: Penne Knee, MD;  Location: [facility];  Service: Urology;  Laterality: Right;   CYSTOSCOPY WITH URETEROSCOPY Right 05/07/2017   Procedure: CYSTOSCOPY WITH URETEROSCOPY;  Surgeon: Penne Knee, MD;  Location: [facility];  Service: Urology;  Laterality: Right;   CYSTOSCOPY/URETEROSCOPY/HOLMIUM LASER/STENT PLACEMENT Right 12/14/2019   Procedure: CYSTOSCOPY/URETEROSCOPY/HOLMIUM LASER/STENT PLACEMENT;  Surgeon: Francisca Redell BROCKS, MD;  Location: [facility];  Service: Urology;  Laterality: Right;   STENT REMOVAL  05/07/2017   Procedure: STENT REMOVAL;  Surgeon: Penne Knee, MD;  Location: [facility];   Service: Urology;;    Social History   Socioeconomic History   Marital status: Single    Spouse name: Not on file   Number of children: Not on file   Years of education: Not on file   Highest education level: Not on file  Occupational History   Not on file  Tobacco Use   Smoking status: Every Day    Types: Cigarettes   Smokeless tobacco: Never  Vaping Use   Vaping status: Never Used  Substance and Sexual Activity   Alcohol use: Yes   Drug use: No   Sexual activity: Yes    Birth control/protection: None  Other Topics Concern   Not on file  Social History Narrative   Not on file   Social Drivers of Health   Tobacco Use: High Risk (04/02/2024)   Patient History    Smoking Tobacco Use: Every Day    Smokeless Tobacco Use: Never    Passive Exposure: Not on file  Financial Resource Strain: Not on file  Food Insecurity: Not on file  Transportation Needs: Not on file  Physical Activity: Not on file  Stress: Not on file  Social Connections: Not on file  Intimate Partner Violence: Not on file  Depression (EYV7-0): Medium Risk (12/15/2022)   Depression (PHQ2-9)    PHQ-2 Score: 10  Alcohol Screen: Not on file  Housing: Not on file  Utilities: Not on file  Health Literacy: Not on file    Family History  Problem Relation Age of Onset   Arthritis  Mother    Heart attack Mother    Diabetes type II Mother    Hypertension Mother    Hyperlipidemia Mother    Heart attack Father    Diabetes type II Father    Hypertension Father    Arthritis Father     Anti-infectives: Anti-infectives (From admission, onward)    None       Current Facility-Administered Medications  Medication Dose Route Frequency Provider Last Rate Last Admin   0.9 %  sodium chloride  infusion   Intravenous Continuous Paudel, Keshab, MD 100 mL/hr at 04/02/24 1039 New Bag at 04/02/24 1039   acetaminophen  (TYLENOL ) tablet 650 mg  650 mg Oral Q6H PRN Roann Gouty, MD       Or   acetaminophen   (TYLENOL ) suppository 650 mg  650 mg Rectal Q6H PRN Paudel, Gouty, MD       enoxaparin  (LOVENOX ) injection 55 mg  0.5 mg/kg Subcutaneous Q24H Hallaji, Sheema M, RPH       morphine  (PF) 2 MG/ML injection 2 mg  2 mg Intravenous Q4H PRN Paudel, Keshab, MD       ondansetron  (ZOFRAN ) tablet 4 mg  4 mg Oral Q6H PRN Paudel, Keshab, MD       Or   ondansetron  (ZOFRAN ) injection 4 mg  4 mg Intravenous Q6H PRN Paudel, Keshab, MD       oxyCODONE  (Oxy IR/ROXICODONE ) immediate release tablet 5 mg  5 mg Oral Q4H PRN Paudel, Keshab, MD       polyethylene glycol (MIRALAX  / GLYCOLAX ) packet 17 g  17 g Oral Daily PRN Paudel, Keshab, MD       Current Outpatient Medications  Medication Sig Dispense Refill   ibuprofen (ADVIL) 200 MG tablet Take 200 mg by mouth every 6 (six) hours as needed for fever, headache or mild pain (pain score 1-3).     ketorolac  (TORADOL ) 10 MG tablet Take 1 tablet (10 mg total) by mouth every 6 (six) hours as needed. (Patient not taking: Reported on 04/02/2024) 20 tablet 0   ondansetron  (ZOFRAN -ODT) 8 MG disintegrating tablet Take 1 tablet (8 mg total) by mouth every 8 (eight) hours as needed for nausea or vomiting. (Patient not taking: Reported on 04/02/2024) 20 tablet 0   oxyCODONE -acetaminophen  (PERCOCET) 5-325 MG tablet Take 1 tablet by mouth every 4 (four) hours as needed for severe pain (pain score 7-10). (Patient not taking: Reported on 04/02/2024) 8 tablet 0     Objective: Vital signs in last 24 hours: BP (!) 137/102   Pulse 62   Temp 97.7 F (36.5 C) (Oral)   Resp 18   Ht 5' 1 (1.549 m)   Wt 107.5 kg   SpO2 94%   BMI 44.78 kg/m   Intake/Output from previous day: No intake/output data recorded. Intake/Output this shift: No intake/output data recorded.   Physical Exam Vitals reviewed.  Constitutional:      Appearance: She is well-developed. She is obese.  Cardiovascular:     Rate and Rhythm: Normal rate and regular rhythm.  Pulmonary:     Effort: Pulmonary effort  is normal. No respiratory distress.  Abdominal:     Palpations: Abdomen is soft.     Tenderness: There is right CVA tenderness.  Skin:    General: Skin is warm and dry.  Neurological:     General: No focal deficit present.     Mental Status: She is alert and oriented to person, place, and time.  Psychiatric:  Mood and Affect: Mood normal.        Behavior: Behavior normal.     Lab Results:  Results for orders placed or performed during the hospital encounter of 04/02/24 (from the past 24 hours)  Lipase, blood     Status: None   Collection Time: 04/02/24  4:47 AM  Result Value Ref Range   Lipase 25 11 - 51 U/L  Comprehensive metabolic panel     Status: Abnormal   Collection Time: 04/02/24  4:47 AM  Result Value Ref Range   Sodium 140 135 - 145 mmol/L   Potassium 4.0 3.5 - 5.1 mmol/L   Chloride 105 98 - 111 mmol/L   CO2 23 22 - 32 mmol/L   Glucose, Bld 148 (H) 70 - 99 mg/dL   BUN 16 6 - 20 mg/dL   Creatinine, Ser 9.03 0.44 - 1.00 mg/dL   Calcium 9.3 8.9 - 89.6 mg/dL   Total Protein 7.4 6.5 - 8.1 g/dL   Albumin 4.2 3.5 - 5.0 g/dL   AST 27 15 - 41 U/L   ALT 25 0 - 44 U/L   Alkaline Phosphatase 103 38 - 126 U/L   Total Bilirubin 0.4 0.0 - 1.2 mg/dL   GFR, Estimated >39 >39 mL/min   Anion gap 12 5 - 15  CBC     Status: Abnormal   Collection Time: 04/02/24  4:47 AM  Result Value Ref Range   WBC 14.4 (H) 4.0 - 10.5 K/uL   RBC 4.54 3.87 - 5.11 MIL/uL   Hemoglobin 12.2 12.0 - 15.0 g/dL   HCT 60.9 63.9 - 53.9 %   MCV 85.9 80.0 - 100.0 fL   MCH 26.9 26.0 - 34.0 pg   MCHC 31.3 30.0 - 36.0 g/dL   RDW 84.1 (H) 88.4 - 84.4 %   Platelets 298 150 - 400 K/uL   nRBC 0.0 0.0 - 0.2 %  Urinalysis, Routine w reflex microscopic -Urine, Clean Catch     Status: Abnormal   Collection Time: 04/02/24  4:47 AM  Result Value Ref Range   Color, Urine STRAW (A) YELLOW   APPearance CLEAR (A) CLEAR   Specific Gravity, Urine 1.016 1.005 - 1.030   pH 7.0 5.0 - 8.0   Glucose, UA NEGATIVE  NEGATIVE mg/dL   Hgb urine dipstick MODERATE (A) NEGATIVE   Bilirubin Urine NEGATIVE NEGATIVE   Ketones, ur NEGATIVE NEGATIVE mg/dL   Protein, ur NEGATIVE NEGATIVE mg/dL   Nitrite NEGATIVE NEGATIVE   Leukocytes,Ua NEGATIVE NEGATIVE   RBC / HPF 6-10 0 - 5 RBC/hpf   WBC, UA 0-5 0 - 5 WBC/hpf   Bacteria, UA NONE SEEN NONE SEEN   Squamous Epithelial / HPF 0-5 0 - 5 /HPF   Mucus PRESENT   Pregnancy, urine     Status: None   Collection Time: 04/02/24  4:47 AM  Result Value Ref Range   Preg Test, Ur NEGATIVE NEGATIVE  CBC     Status: Abnormal   Collection Time: 04/02/24 10:43 AM  Result Value Ref Range   WBC 11.5 (H) 4.0 - 10.5 K/uL   RBC 4.07 3.87 - 5.11 MIL/uL   Hemoglobin 10.9 (L) 12.0 - 15.0 g/dL   HCT 64.7 (L) 63.9 - 53.9 %   MCV 86.5 80.0 - 100.0 fL   MCH 26.8 26.0 - 34.0 pg   MCHC 31.0 30.0 - 36.0 g/dL   RDW 84.0 (H) 88.4 - 84.4 %   Platelets 262 150 - 400 K/uL  nRBC 0.0 0.0 - 0.2 %  Creatinine, serum     Status: None   Collection Time: 04/02/24 10:43 AM  Result Value Ref Range   Creatinine, Ser 0.84 0.44 - 1.00 mg/dL   GFR, Estimated >39 >39 mL/min    BMET Recent Labs    04/02/24 0447 04/02/24 1043  NA 140  --   K 4.0  --   CL 105  --   CO2 23  --   GLUCOSE 148*  --   BUN 16  --   CREATININE 0.96 0.84  CALCIUM 9.3  --    PT/INR No results for input(s): LABPROT, INR in the last 72 hours. ABG No results for input(s): PHART, HCO3 in the last 72 hours.  Invalid input(s): PCO2, PO2  Studies/Results: CT ABDOMEN PELVIS W CONTRAST Result Date: 04/02/2024 EXAM: CT ABDOMEN AND PELVIS WITH CONTRAST 04/02/2024 07:57:42 AM TECHNIQUE: CT of the abdomen and pelvis was performed with the administration of 100 mL of iohexol  (OMNIPAQUE ) 300 MG/ML solution. Multiplanar reformatted images are provided for review. Automated exposure control, iterative reconstruction, and/or weight-based adjustment of the mA/kV was utilized to reduce the radiation dose to as low as  reasonably achievable. COMPARISON: 07/10/2023 CLINICAL HISTORY: Right sided flank pain, radiation to RLQ abd. FINDINGS: LOWER CHEST: No acute abnormality. LIVER: Subjective hepatic steatosis. GALLBLADDER AND BILE DUCTS: Gallbladder is unremarkable. No biliary ductal dilatation. SPLEEN: The spleen is within normal limits in size and appearance. PANCREAS: The pancreas is normal in size and contour without focal lesion or ductal dilatation. ADRENAL GLANDS: Normal size and morphology bilaterally. No nodule, thickening, or hemorrhage. No periadrenal stranding. KIDNEYS, URETERS AND BLADDER: Punctate stone noted in the upper pole of the left kidney. Several right kidney stones are noted. The largest is in the upper pole measuring 5 mm, image 32/2. Cyst within the upper pole of the right kidney compatible with a Bosniak class 1 lesion, measures 1.4 cm. Per consensus, no follow-up is needed for simple Bosniak type 1 and 2 renal cysts, unless the patient has a malignancy history or risk factors. There is right-sided hydronephrosis. At the right UPJ, there is a stone measuring 1.1 cm by 0.9 cm. Urinary bladder is unremarkable. GI AND BOWEL: Stomach appears normal. Injured appendix. The sigmoid colon demonstrates diverticulosis without evidence of diverticulitis. There is no bowel wall thickening, pericolonic stranding, abscess, or free air. There is no bowel obstruction. PERITONEUM AND RETROPERITONEUM: No ascites or focal fluid collections. No free air. VASCULATURE: Aorta is normal in caliber. LYMPH NODES: No lymphadenopathy. REPRODUCTIVE ORGANS: The uterus and adnexal structures are unremarkable. BONES AND SOFT TISSUES: Tiny fat-containing umbilical hernia. Asymmetric increased sclerosis within the right iliac bone measures 2.8 x 1.8 cm, image 59/2. Unchanged from 12/21/2020. IMPRESSION: 1. Right-sided hydronephrosis due to a 1.1 x 0.9 cm stone at the right UPJ. 2. Additional bilateral nonobstructing renal calculi, largest 5  mm in the right upper pole. Electronically signed by: Waddell Calk MD 04/02/2024 08:10 AM EST RP Workstation: [id number]     Assessment/Plan: Right UPJ stone with obstruction and difficult to manage pain.   I have discussed options with her and will try to get her set up for cystoscopy with ureteroscopy with laser and stenting this afternoon.   I have reviewed the risks of the procedure and she will be admitted to the medical service but could be discharged after the procedure.          No follow-ups on file.    CC: Dr. Ester  Claudene Norleen Seltzer 04/02/2024     [1]  Allergies Allergen Reactions   Amoxicillin Hives and Swelling    Has patient had a PCN reaction causing immediate rash, facial/tongue/throat swelling, SOB or lightheadedness with hypotension: No Has patient had a PCN reaction causing severe rash involving mucus membranes or skin necrosis: No Has patient had a PCN reaction that required hospitalization: No Has patient had a PCN reaction occurring within the last 10 years: Yes If all of the above answers are NO, then may proceed with Cephalosporin use.    Sulfa Antibiotics Hives   "

## 2024-04-02 NOTE — Progress Notes (Signed)
.. ° ° °  PROCEDURAL EXPEDITER PROGRESS NOTE  Patient Name: Toni Salazar  DOB:1985/06/17 Date of Admission: 04/02/2024  Date of Assessment:04/02/24   -------------------------------------------------------------------------------------------------------------------   Brief clinical summary: Pt to OR today for cystoscopy with right ureteroscopy with laser application and stent insertion   Orders in place:  Yes   Labs, test, and orders reviewed: Y  Requires surgical clearance:  No  Barriers noted: N/A   -------------------------------------------------------------------------------------------------------------------  [facility], [place], [place] Please contact us  directly via secure chat (search for [facility]) or by calling us  at [phone] [facility]).

## 2024-04-02 NOTE — Discharge Instructions (Signed)
 You may remove the stent on Thursday morning by pulling the attached string that is tucked vaginally.  If you don't feel you can do that or don't find the string, please call the office.    Food Resources  Agency Name: [facility] Address: [address], [place], [place] [address] Phone: [phone] Website: [email or web address] Service(s) Offered: Housing services, self-sufficiency, congregate meal program, weatherization program, event organiser program, emergency food assistance,  housing counseling, home ownership program, wheels - to work program.  [employer] free for 60 and older at various locations from usaa, Monday-Friday:  [employer], [address] [place], [phone] -[facility] [address], [place] [phone]  -[facility], [address], [place] [phone]  -[address], [place], [phone]  Agency Name: [facility] Address: [phone] [address], [place], [place] [address] Phone: [phone] Website: [email or web address] Service(s) Offered: Home delivered hot, frozen, and emergency  meals. Grocery assistance program which matches  volunteers one-on-one with seniors unable to grocery shop  for themselves. Must be 60 years and older; less than 20  hours of in-home aide service, limited or no driving ability;  live alone or with someone with a disability; live in  [place].  Agency Name: [employer] [facility] [employer]) Address: [address] [place], [place] [address] Phone: [phone] Service(s) Offered: Food is served to shut-ins, homeless, elderly, and low income people in the community every Saturday (11:30 am-12:30 pm) and Sunday (12:30 pm-1:30pm). Volunteers also offer help and encouragement in seeking employment,  and spiritual guidance.  Agency Name: [employer] Address: [address] [place], [place] [address] Phone: [phone] Service(s) Offered: Child support services; child welfare services; food stamps; Medicaid; work first family assistance; and aid with fuel,  rent, food and medicine.  Agency Name: [employer] Address: [address], [place], [place] Phone: [phone] Website: [email or web address] Services Offered: Monday 10:00am-12:00, 8:00pm-9:00pm, and Friday 10:00am-12:00.  Agency Name: [employer] Address: [address] [place], [place] [address] Phone: [phone] Website: [email or web address] Service(s) Offered: Serves weekday meals, open from 11:30 am- 1:00 pm., and 6:30-7:30pm, Monday-Wednesday-Friday distributes food 3:30-6pm, Monday-Wednesday-Friday.  Agency Name: [facility] Address: [address] [place], [place] Phone: [phone] Website: [email or web address] Services Offered: Distributes food the 4th Saturday of the month, starting at 8:00 am  Agency Name: [facility] Address: [phone] [address], [place], [place] [address] Phone: [phone] Website: [email or web address] Service(s) Offered: [employer] of life, weekly food pantry. Open Wednesdays from 10:00am-noon.  Agency Name: [facility] [employer] Address: [address] [place], [place], [place] Phone: [phone] Services Offered: Distributes food 9am-1pm, Monday-Thursday. Call for details.  Agency Name: [facility] Address: [address], [place], [place] [address] Phone: [phone] Website: [email or web address] Service(s) Offered: Games Developer. Call for assistance.  Agency Name: [facility] Address: [address] [place], [place] [address] Phone: [phone] Service Offered: Emergency Food Pantry. Call for appointment.  Agency Name: [facility] Address: [address] [place], [place]  [address] Phone: [phone] Website: [email or web address] Services Offered: Games Developer. Call for details  Agency Name: [facility] Address: [address] [place], [place] Phone: [phone] Website: [email or web address] Service(s) Offered: Emergency Food Pantry. Call for details.  Agency Name: [employer] Address: [address] [place], [place] [address] Phone: [phone] or [phone] Website: [email or web address] Service(s) Offered: Distribute food 9am-11:30 am, Tuesday-Friday, and  1-3:30pm, Monday-Friday. Food pantry Monday-Friday 1pm-3pm, fresh items, Mon.-Wed.-Fri.  Agency Name: [facility] Address: [address] [place], [place] [address] Phone: [phone] Website: [email or web address] Services Offered: Distribute food Tues and Sats from 9:00am-noon. Closed 1st Saturday of each month. Call for details  Agency Name: [employer] Address: [address] [facility] [address], [place] [address] Phone: [phone]  Services Offered: Delivers meals every Thursday

## 2024-04-02 NOTE — ED Triage Notes (Signed)
 Sharp lower abd pain since Thursday. Reports nausea and diarrhea with decreased oral intake. Denies vomiting. Reports hx of kidney stones and diverticulitis. States pain doesn't feel similar to either. Denies urinary symptoms. Reports some concern for gallbladder issue due to quick bm after eating. Pt ambulatory to triage. Alert and oriented following commands. Breathing unlabored speaking in full sentences with symmetric chest rise and fall.

## 2024-04-02 NOTE — Anesthesia Procedure Notes (Signed)
 Procedure Name: Intubation Date/Time: 04/02/2024 4:15 PM  Performed by: Tod Handing, CRNAPre-anesthesia Checklist: Patient identified, Patient being monitored, Timeout performed, Emergency Drugs available and Suction available Patient Re-evaluated:Patient Re-evaluated prior to induction Oxygen Delivery Method: Circle system utilized Preoxygenation: Pre-oxygenation with 100% oxygen Induction Type: IV induction Ventilation: Mask ventilation with difficulty Laryngoscope Size: Mac, 3 and McGrath Grade View: Grade I Tube type: Oral Tube size: 7.0 mm Number of attempts: 1 Airway Equipment and Method: Stylet Placement Confirmation: ETT inserted through vocal cords under direct vision, positive ETCO2 and breath sounds checked- equal and bilateral Secured at: 21 cm Tube secured with: Tape Dental Injury: Teeth and Oropharynx as per pre-operative assessment

## 2024-04-02 NOTE — ED Notes (Signed)
 Pt clothing removed and gown provided for OR.

## 2024-04-02 NOTE — H&P (Signed)
 "  History and Physical    Toni Salazar FMW:[id number] DOB: 23-Mar-1985 DOA: 04/02/2024  DOS: the patient was seen and examined on 04/02/2024  PCP: Gretta Comer POUR, NP   Patient coming from: Home  I have personally briefly reviewed patient's old medical records in [facility]  Chief Complaint: Right flank pain  HPI: Toni Salazar is a pleasant 39 y.o. female with medical history significant for urolithiasis with prior endoscopic management in 2019 and 21 who presented to ED complaining of right flank pain and nausea without fever or voiding symptoms.  Patient stated that her pain was 9/10 in intensity, sharp, associate with right back pain.  She denied any fever, chills, dysuria.  ED Course: Upon arrival to the ED, patient is found to have positive UA which showed RBCs and CT scan of the abdomen demonstrated 1 cm right UPJ stone with bilateral renal stones with the largest 5 mm in the right kidney.  She has a very mild leukocytosis.  Urology was consulted who will come and see the patient.  Review of Systems:  ROS  All other systems negative except as noted in the HPI.  Past Medical History:  Diagnosis Date   Acute diverticulitis    GAD (generalized anxiety disorder)    History of kidney stones    Kidney stone    PTSD (post-traumatic stress disorder)    Right ureteral stone 04/29/2017    Past Surgical History:  Procedure Laterality Date   CESAREAN SECTION     CYSTOSCOPY W/ RETROGRADES  12/14/2019   Procedure: CYSTOSCOPY WITH RETROGRADE PYELOGRAM;  Surgeon: Toni Redell BROCKS, MD;  Location: [facility];  Service: Urology;;   CYSTOSCOPY WITH STENT PLACEMENT Right 04/29/2017   Procedure: CYSTOSCOPY WITH STENT PLACEMENT;  Surgeon: Toni Knee, MD;  Location: [facility];  Service: Urology;  Laterality: Right;   CYSTOSCOPY WITH URETEROSCOPY Right 05/07/2017   Procedure: CYSTOSCOPY WITH URETEROSCOPY;  Surgeon: Toni Knee, MD;  Location: [facility];  Service: Urology;   Laterality: Right;   CYSTOSCOPY/URETEROSCOPY/HOLMIUM LASER/STENT PLACEMENT Right 12/14/2019   Procedure: CYSTOSCOPY/URETEROSCOPY/HOLMIUM LASER/STENT PLACEMENT;  Surgeon: Toni Redell BROCKS, MD;  Location: [facility];  Service: Urology;  Laterality: Right;   STENT REMOVAL  05/07/2017   Procedure: STENT REMOVAL;  Surgeon: Toni Knee, MD;  Location: [facility];  Service: Urology;;     reports that she has been smoking cigarettes. She has never used smokeless tobacco. She reports current alcohol use. She reports that she does not use drugs.  Allergies[1]  Family History  Problem Relation Age of Onset   Arthritis Mother    Heart attack Mother    Diabetes type II Mother    Hypertension Mother    Hyperlipidemia Mother    Heart attack Father    Diabetes type II Father    Hypertension Father    Arthritis Father     Prior to Admission medications  Medication Sig Start Date End Date Taking? Authorizing Provider  ibuprofen (ADVIL) 200 MG tablet Take 200 mg by mouth every 6 (six) hours as needed for fever, headache or mild pain (pain score 1-3).    [provider]  ketorolac  (TORADOL ) 10 MG tablet Take 1 tablet (10 mg total) by mouth every 6 (six) hours as needed. 07/10/23   Bradler, Evan K, MD  ondansetron  (ZOFRAN -ODT) 8 MG disintegrating tablet Take 1 tablet (8 mg total) by mouth every 8 (eight) hours as needed for nausea or vomiting. 07/10/23   Bradler, Evan K, MD  oxyCODONE -acetaminophen  (  PERCOCET) 5-325 MG tablet Take 1 tablet by mouth every 4 (four) hours as needed for severe pain (pain score 7-10). 07/10/23   Toni Artist POUR, MD    Physical Exam: Vitals:   04/02/24 0700 04/02/24 1012 04/02/24 1015 04/02/24 1030  BP: 126/84  (!) 154/90 (!) 137/102  Pulse: 62  81 62  Resp:      Temp:  97.7 F (36.5 C)    TempSrc:  Oral    SpO2: (!) 89%  96% 94%  Weight:      Height:        Physical Exam   Constitutional: Alert, awake, calm, comfortable HEENT: Neck supple Respiratory:  Clear to auscultation B/L, no wheezing, no rales.  Cardiovascular: Regular rate and rhythm, no murmurs / rubs / gallops. No extremity edema. 2+ pedal pulses. No carotid bruits.  Abdomen: Soft, no tenderness, Bowel sounds positive.  Right CVA tenderness. Musculoskeletal: no clubbing / cyanosis. Good ROM, no contractures. Normal muscle tone.  Skin: no rashes, lesions, ulcers. Neurologic: CN 2-12 grossly intact. Sensation intact, No focal deficit identified Psychiatric: Alert and oriented x 3. Normal mood.    Labs on Admission: I have personally reviewed following labs and imaging studies  CBC: Recent Labs  Lab 04/02/24 0447 04/02/24 1043  WBC 14.4* 11.5*  HGB 12.2 10.9*  HCT 39.0 35.2*  MCV 85.9 86.5  PLT 298 262   Basic Metabolic Panel: Recent Labs  Lab 04/02/24 0447 04/02/24 1043  NA 140  --   K 4.0  --   CL 105  --   CO2 23  --   GLUCOSE 148*  --   BUN 16  --   CREATININE 0.96 0.84  CALCIUM 9.3  --    GFR: Estimated Creatinine Clearance: 103.8 mL/min (by C-G formula based on SCr of 0.84 mg/dL). Liver Function Tests: Recent Labs  Lab 04/02/24 0447  AST 27  ALT 25  ALKPHOS 103  BILITOT 0.4  PROT 7.4  ALBUMIN 4.2   Recent Labs  Lab 04/02/24 0447  LIPASE 25   No results for input(s): AMMONIA in the last 168 hours. Coagulation Profile: No results for input(s): INR, PROTIME in the last 168 hours. Cardiac Enzymes: No results for input(s): CKTOTAL, CKMB, CKMBINDEX, TROPONINI, TROPONINIHS in the last 168 hours. BNP (last 3 results) No results for input(s): BNP in the last 8760 hours. HbA1C: No results for input(s): HGBA1C in the last 72 hours. CBG: No results for input(s): GLUCAP in the last 168 hours. Lipid Profile: No results for input(s): CHOL, HDL, LDLCALC, TRIG, CHOLHDL, LDLDIRECT in the last 72 hours. Thyroid  Function Tests: No results for input(s): TSH, T4TOTAL, FREET4, T3FREE, THYROIDAB in the last 72  hours. Anemia Panel: No results for input(s): VITAMINB12, FOLATE, FERRITIN, TIBC, IRON, RETICCTPCT in the last 72 hours. Urine analysis:    Component Value Date/Time   COLORURINE STRAW (A) 04/02/2024 0447   APPEARANCEUR CLEAR (A) 04/02/2024 0447   APPEARANCEUR Cloudy (A) 01/03/2020 1334   LABSPEC 1.016 04/02/2024 0447   LABSPEC 1.004 03/24/2011 2021   PHURINE 7.0 04/02/2024 0447   GLUCOSEU NEGATIVE 04/02/2024 0447   GLUCOSEU Negative 03/24/2011 2021   HGBUR MODERATE (A) 04/02/2024 0447   BILIRUBINUR NEGATIVE 04/02/2024 0447   BILIRUBINUR Negative 01/03/2020 1334   BILIRUBINUR Negative 03/24/2011 2021   KETONESUR NEGATIVE 04/02/2024 0447   PROTEINUR NEGATIVE 04/02/2024 0447   NITRITE NEGATIVE 04/02/2024 0447   LEUKOCYTESUR NEGATIVE 04/02/2024 0447   LEUKOCYTESUR Negative 03/24/2011 2021    Radiological Exams  on Admission: I have personally reviewed images CT ABDOMEN PELVIS W CONTRAST Result Date: 04/02/2024 EXAM: CT ABDOMEN AND PELVIS WITH CONTRAST 04/02/2024 07:57:42 AM TECHNIQUE: CT of the abdomen and pelvis was performed with the administration of 100 mL of iohexol  (OMNIPAQUE ) 300 MG/ML solution. Multiplanar reformatted images are provided for review. Automated exposure control, iterative reconstruction, and/or weight-based adjustment of the mA/kV was utilized to reduce the radiation dose to as low as reasonably achievable. COMPARISON: 07/10/2023 CLINICAL HISTORY: Right sided flank pain, radiation to RLQ abd. FINDINGS: LOWER CHEST: No acute abnormality. LIVER: Subjective hepatic steatosis. GALLBLADDER AND BILE DUCTS: Gallbladder is unremarkable. No biliary ductal dilatation. SPLEEN: The spleen is within normal limits in size and appearance. PANCREAS: The pancreas is normal in size and contour without focal lesion or ductal dilatation. ADRENAL GLANDS: Normal size and morphology bilaterally. No nodule, thickening, or hemorrhage. No periadrenal stranding. KIDNEYS, URETERS AND  BLADDER: Punctate stone noted in the upper pole of the left kidney. Several right kidney stones are noted. The largest is in the upper pole measuring 5 mm, image 32/2. Cyst within the upper pole of the right kidney compatible with a Bosniak class 1 lesion, measures 1.4 cm. Per consensus, no follow-up is needed for simple Bosniak type 1 and 2 renal cysts, unless the patient has a malignancy history or risk factors. There is right-sided hydronephrosis. At the right UPJ, there is a stone measuring 1.1 cm by 0.9 cm. Urinary bladder is unremarkable. GI AND BOWEL: Stomach appears normal. Injured appendix. The sigmoid colon demonstrates diverticulosis without evidence of diverticulitis. There is no bowel wall thickening, pericolonic stranding, abscess, or free air. There is no bowel obstruction. PERITONEUM AND RETROPERITONEUM: No ascites or focal fluid collections. No free air. VASCULATURE: Aorta is normal in caliber. LYMPH NODES: No lymphadenopathy. REPRODUCTIVE ORGANS: The uterus and adnexal structures are unremarkable. BONES AND SOFT TISSUES: Tiny fat-containing umbilical hernia. Asymmetric increased sclerosis within the right iliac bone measures 2.8 x 1.8 cm, image 59/2. Unchanged from 12/21/2020. IMPRESSION: 1. Right-sided hydronephrosis due to a 1.1 x 0.9 cm stone at the right UPJ. 2. Additional bilateral nonobstructing renal calculi, largest 5 mm in the right upper pole. Electronically signed by: Waddell Calk MD 04/02/2024 08:10 AM EST RP Workstation: [id number]    EKG: N/A    Assessment/Plan Principal Problem:   Hydronephrosis    Assessment and Plan: 39 year old female with who came into ED complaining of right flank pain with positive blood in the urine analysis and CT scan finding of right hydronephrosis and stone.  1.  Right-sided hydronephrosis due to right UPJ stone - She will be placed in observation. - Urology has been consulted. - IV fluid, pain medication, NPO. - Further plan per  urology.  2.  Mild leukocytosis. - No fever, no chills. - UA does not show any signs of infection. - Will follow the recommendation from urology.    DVT prophylaxis: Lovenox  Code Status: Full Code Family Communication: None Disposition Plan: Home Consults called: Urology Admission status: Observation, Med-Surg   Nena Rebel, MD [facility] 04/02/2024, 12:07 PM        [1]  Allergies Allergen Reactions   Amoxicillin Hives and Swelling    Has patient had a PCN reaction causing immediate rash, facial/tongue/throat swelling, SOB or lightheadedness with hypotension: No Has patient had a PCN reaction causing severe rash involving mucus membranes or skin necrosis: No Has patient had a PCN reaction that required hospitalization: No Has patient had a PCN reaction occurring within the last  10 years: Yes If all of the above answers are NO, then may proceed with Cephalosporin use.    Sulfa Antibiotics Hives   "

## 2024-04-02 NOTE — ED Provider Notes (Signed)
 Patient received in signout from Dr. Fernand.  Further uncontrolled ureteral colic pain.  1.1 cm proximal stone.  Urine without infectious features.  I consult with urology who will evaluate for possible ureteral stenting, consult medicine who agrees to admit due to poorly controlled pain   Clinical Course as of 04/02/24 0927  Sun Apr 02, 2024  0912 I consult with Dr. Watt. Keep her NPO, admit for pain. He will see [DS]  0926 I consult with medicine who agrees to admit [DS]    Clinical Course User Index [DS] Claudene Rover, MD       Claudene Rover, MD 04/02/24 [phone]

## 2024-04-02 NOTE — Transfer of Care (Signed)
 Immediate Anesthesia Transfer of Care Note  Patient: Toni Salazar  Procedure(s) Performed: CYSTOSCOPY/URETEROSCOPY/HOLMIUM LASER/STENT PLACEMENT (Right)  Patient Location: PACU  Anesthesia Type:General  Level of Consciousness: drowsy  Airway & Oxygen Therapy: Patient Spontanous Breathing and Patient connected to face mask oxygen  Post-op Assessment: Report given to RN and Post -op Vital signs reviewed and stable  Post vital signs: Reviewed and stable  Last Vitals:  Vitals Value Taken Time  BP 123/78 04/02/24  1719   Temp 36.9 C 04/02/24 17:20  Pulse 80 04/02/24 17:20  Resp 19 04/02/24 17:20  SpO2 96 % 04/02/24 17:20  Vitals shown include unfiled device data.  Last Pain:  Vitals:   04/02/24 1430  TempSrc:   PainSc: Asleep         Complications: No notable events documented.

## 2024-04-02 NOTE — Hospital Course (Signed)
 HPI: Toni Salazar is a pleasant 39 y.o. female with medical history significant for urolithiasis with prior endoscopic management in 2019 and 21 who presented to ED complaining of right flank pain and nausea without fever or voiding symptoms.  Patient stated that her pain was 9/10 in intensity, sharp, associate with right back pain.  She denied any fever, chills, dysuria.   ED Course: Upon arrival to the ED, patient is found to have positive UA which showed RBCs and CT scan of the abdomen demonstrated 1 cm right UPJ stone with bilateral renal stones with the largest 5 mm in the right kidney.  She has a very mild leukocytosis.  Urology was consulted who will come and see the patient.

## 2024-04-02 NOTE — ED Provider Notes (Signed)
 "  [facility] Provider Note    Event Date/Time   First MD Initiated Contact with Patient 04/02/24 [phone]     (approximate)   History   Abdominal Pain   HPI  LYLE LEISNER is a 39 y.o. female presenting with concern of 2 days of lower abdominal pain.  Pain described as primarily in the right lower quadrant radiating to right flank.  She has a history of diverticulitis as well as nephrolithiasis, she states that this does not feel like either of those this time.  Some mild nausea affiliated with symptoms no episodes of vomiting.  Denies any diarrhea constipation, no issues with urination, she denies any vaginal bleeding or discharge.  History of stent for nephrolithiasis in the past as well as C-section denied other surgical history.     Physical Exam   Triage Vital Signs: ED Triage Vitals  Encounter Vitals Group     BP      Girls Systolic BP Percentile      Girls Diastolic BP Percentile      Boys Systolic BP Percentile      Boys Diastolic BP Percentile      Pulse      Resp      Temp      Temp src      SpO2      Weight      Height      Head Circumference      Peak Flow      Pain Score      Pain Loc      Pain Education      Exclude from Growth Chart     Most recent vital signs: Vitals:   04/02/24 0600 04/02/24 0630  BP: (!) 155/102 125/71  Pulse: 94 72  Resp:    Temp:    SpO2: 94% 91%     General: Awake, no distress.  CV:  Good peripheral perfusion.  Resp:  Normal effort.  Abd:  No distention.  Soft, there is discomfort to palpation along the right lower quadrant of the abdomen as well as right flank Other:     ED Results / Procedures / Treatments   Labs (all labs ordered are listed, but only abnormal results are displayed) Labs Reviewed  COMPREHENSIVE METABOLIC PANEL WITH GFR - Abnormal; Notable for the following components:      Result Value   Glucose, Bld 148 (*)    All other components within normal limits  CBC -  Abnormal; Notable for the following components:   WBC 14.4 (*)    RDW 15.8 (*)    All other components within normal limits  URINALYSIS, ROUTINE W REFLEX MICROSCOPIC - Abnormal; Notable for the following components:   Color, Urine STRAW (*)    APPearance CLEAR (*)    Hgb urine dipstick MODERATE (*)    All other components within normal limits  LIPASE, BLOOD  POC URINE PREG, ED     EKG     RADIOLOGY   PROCEDURES:  Critical Care performed: No  Procedures   MEDICATIONS ORDERED IN ED: Medications  sodium chloride  0.9 % bolus 1,000 mL (1,000 mLs Intravenous New Bag/Given 04/02/24 0530)  ondansetron  (ZOFRAN ) injection 4 mg (4 mg Intravenous Given 04/02/24 0531)  ketorolac  (TORADOL ) 15 MG/ML injection 15 mg (15 mg Intravenous Given 04/02/24 0532)  morphine  (PF) 4 MG/ML injection 4 mg (4 mg Intravenous Given 04/02/24 0606)     IMPRESSION / MDM / ASSESSMENT AND PLAN /  ED COURSE  I reviewed the triage vital signs and the nursing notes.                               Patient's presentation is most consistent with acute complicated illness / injury requiring diagnostic workup.  39 year old female who presents with concern of lower abdominal pain.  Clinically appears consistent with nephrolithiasis but she given her underlying history.  She informed me that this does not feel like her prior stones.  Will obtain CT imaging with contrast given the different nature of the pain.  Will attempt symptomatic management and determine further workup accordingly.       FINAL CLINICAL IMPRESSION(S) / ED DIAGNOSES   Final diagnoses:  Right lower quadrant abdominal pain     Rx / DC Orders   ED Discharge Orders     None        Note:  This document was prepared using Dragon voice recognition software and may include unintentional dictation errors.   Fernand Rossie HERO, MD 04/02/24 [phone]  "

## 2024-04-02 NOTE — Op Note (Signed)
 Procedure: 1.  Cystoscopy with right retrograde pyelogram and interpretation. 2.  Right ureteroscopy with holmium laser application, stone extraction and placement of right double-J stent. 3.  Application of fluoroscopy.  Pre-Op diagnosis: 1 cm right UPJ stone and 5 mm right renal stone.  Postop diagnosis: Same.  Surgeon: Dr. Norleen Seltzer.  Anesthesia: General.  Specimen: None.  Drains: 6 French by 24 cm right Contour double-J stent with tether.  EBL: None.  Complications: None.  Indications: Toni Salazar is a 39 year old female with history recurrent urolithiasis with prior ureteroscopy's for stones who presented to emergency room with a few day history of right flank pain that was difficult to manage.  CT scan demonstrated a 1 cm right UPJ stone with obstruction and a smaller renal stone.  After reviewing the options she is elected ureteroscopy and stenting.  Procedure: She was taken the operating room where she was given Cipro .  General anesthetic was induced.  She was placed in lithotomy position and then prepped with Hibiclens.  She was draped in usual sterile fashion.  Cystoscopy was performed using the 21 French scope and 30 degree lens.  Examination revealed a normal urethra.  The bladder wall was smooth and pale without tumors, stones or inflammation.  Ureteral orifices were unremarkable.  The right ureteral orifice was cannulated with 5 French open-ended catheter and Omnipaque  was instilled.  The right retrograde pyelogram demonstrated a normal caliber ureter to the level of the UPJ where there is an opacity and filling defect consistent with a stone with some proximal dilation.  A sensor wire was then advanced through the open-ended catheter to the kidney and passed the stone.  The inner core of a 12/14 French 36 cm digital access sheath was then passed over the wire without difficulty to the UPJ.  This was followed by the assembled sheath.  The inner core was removed and a second  wire was then passed through the sheath to the kidney and then the sheath was removed, reassembled and reinserted over one of the 2 wires with the remaining wire left as a safety wire.  The inner core and working wire were then removed.  The dual-lumen digital ureteroscope was then advanced the kidney and the stone was visualized in the renal pelvis.  Further inspection demonstrated approximately 5 mm stone in the upper pole and a small Randall's plaque/stone in the midpole.  The 200 m homing laser fiber was then used with the Moses laser set on the dusting setting.  Both right and left pedals were used for fragmentation.  Initially the midpole adherent stone was fragmented and released from the calyceal tip.  The upper pole stone was then fragmented into manageable fragments followed by the renal pelvic stone.  Once the stones were adequately fragmented ZeroTip nitinol basket was used to remove the stone fragments until there was nothing left but grit and sand.  Once the stone had been adequately fragmented and removed, the ureteroscope was removed along with the sheath while inspecting the ureter.  No significant ureteral injury was noted.  The cystoscope was then reinserted over the safety wire and a 6 French by 24 cm contour double-J stent with tether was advanced kidney under fluoroscopic guidance.  The wire was removed, leaving a good coil in the kidney and a good coil in the bladder.  The bladder was drained and the cystoscope was removed.  This did not string was left exiting urethra.  It was tied close to the meatus, trimmed to an  appropriate length and tucked vaginally.  She was taken down from lithotomy position, her anesthetic was reversed and she was moved to recovery in stable condition.  There were no complications.  She will be given her stone fragments.

## 2024-04-02 NOTE — Anesthesia Preprocedure Evaluation (Addendum)
 "                                  Anesthesia Evaluation  Patient identified by MRN, date of birth, ID band Patient awake    Reviewed: Allergy & Precautions, NPO status , Patient's Chart, lab work & pertinent test results  History of Anesthesia Complications Negative for: history of anesthetic complications  Airway Mallampati: III  TM Distance: >3 FB Neck ROM: full    Dental no notable dental hx.    Pulmonary asthma , sleep apnea and Continuous Positive Airway Pressure Ventilation , Current Smoker   Pulmonary exam normal        Cardiovascular negative cardio ROS Normal cardiovascular exam     Neuro/Psych  PSYCHIATRIC DISORDERS Anxiety     negative neurological ROS     GI/Hepatic negative GI ROS, Neg liver ROS,,,  Endo/Other    Class 3 obesity  Renal/GU Renal disease     Musculoskeletal   Abdominal   Peds  Hematology negative hematology ROS (+)   Anesthesia Other Findings Past Medical History: No date: Acute diverticulitis No date: GAD (generalized anxiety disorder) No date: History of kidney stones No date: Kidney stone No date: PTSD (post-traumatic stress disorder) 04/29/2017: Right ureteral stone  Past Surgical History: No date: CESAREAN SECTION 12/14/2019: CYSTOSCOPY W/ RETROGRADES     Comment:  Procedure: CYSTOSCOPY WITH RETROGRADE PYELOGRAM;                Surgeon: Francisca Redell BROCKS, MD;  Location: [facility];                Service: Urology;; 04/29/2017: CYSTOSCOPY WITH STENT PLACEMENT; Right     Comment:  Procedure: CYSTOSCOPY WITH STENT PLACEMENT;  Surgeon:               Penne Knee, MD;  Location: [facility];  Service:               Urology;  Laterality: Right; 05/07/2017: CYSTOSCOPY WITH URETEROSCOPY; Right     Comment:  Procedure: CYSTOSCOPY WITH URETEROSCOPY;  Surgeon:               Penne Knee, MD;  Location: [facility];  Service:               Urology;  Laterality: Right; 12/14/2019: CYSTOSCOPY/URETEROSCOPY/HOLMIUM LASER/STENT  PLACEMENT;  Right     Comment:  Procedure: CYSTOSCOPY/URETEROSCOPY/HOLMIUM LASER/STENT               PLACEMENT;  Surgeon: Francisca Redell BROCKS, MD;  Location:               [facility];  Service: Urology;  Laterality: Right; 05/07/2017: STENT REMOVAL     Comment:  Procedure: STENT REMOVAL;  Surgeon: Penne Knee, MD;              Location: [facility];  Service: Urology;;  BMI    Body Mass Index: 44.78 kg/m      Reproductive/Obstetrics negative OB ROS                              Anesthesia Physical Anesthesia Plan  ASA: 3  Anesthesia Plan: General ETT   Post-op Pain Management: Ofirmev  IV (intra-op)*   Induction: Intravenous  PONV Risk Score and Plan: 3 and Ondansetron , Dexamethasone , Midazolam  and Treatment may vary due to age or medical condition  Airway Management Planned: Oral ETT  Additional Equipment:   Intra-op Plan:   Post-operative Plan: Extubation in OR  Informed Consent: I have reviewed the patients History and Physical, chart, labs and discussed the procedure including the risks, benefits and alternatives for the proposed anesthesia with the patient or authorized representative who has indicated his/her understanding and acceptance.     Dental Advisory Given  Plan Discussed with: Anesthesiologist, CRNA and Surgeon  Anesthesia Plan Comments: (Patient consented for risks of anesthesia including but not limited to:  - adverse reactions to medications - damage to eyes, teeth, lips or other oral mucosa - nerve damage due to positioning  - sore throat or hoarseness - Damage to heart, brain, nerves, lungs, other parts of body or loss of life  Patient voiced understanding and assent.)         Anesthesia Quick Evaluation  "

## 2024-04-03 ENCOUNTER — Telehealth: Payer: Self-pay | Admitting: Physician Assistant

## 2024-04-03 ENCOUNTER — Other Ambulatory Visit: Payer: Self-pay

## 2024-04-03 ENCOUNTER — Encounter: Payer: Self-pay | Admitting: Urology

## 2024-04-03 DIAGNOSIS — N202 Calculus of kidney with calculus of ureter: Secondary | ICD-10-CM

## 2024-04-03 LAB — BASIC METABOLIC PANEL WITH GFR
Anion gap: 13 (ref 5–15)
BUN: 18 mg/dL (ref 6–20)
CO2: 24 mmol/L (ref 22–32)
Calcium: 9.4 mg/dL (ref 8.9–10.3)
Chloride: 102 mmol/L (ref 98–111)
Creatinine, Ser: 1.04 mg/dL — ABNORMAL HIGH (ref 0.44–1.00)
GFR, Estimated: >60 mL/min
Glucose, Bld: 232 mg/dL — ABNORMAL HIGH (ref 70–99)
Potassium: 4.4 mmol/L (ref 3.5–5.1)
Sodium: 139 mmol/L (ref 135–145)

## 2024-04-03 LAB — CBC
HCT: 38.6 % (ref 36.0–46.0)
Hemoglobin: 12 g/dL (ref 12.0–15.0)
MCH: 27.4 pg (ref 26.0–34.0)
MCHC: 31.1 g/dL (ref 30.0–36.0)
MCV: 88.1 fL (ref 80.0–100.0)
Platelets: 350 K/uL (ref 150–400)
RBC: 4.38 MIL/uL (ref 3.87–5.11)
RDW: 15.7 % — ABNORMAL HIGH (ref 11.5–15.5)
WBC: 17.6 K/uL — ABNORMAL HIGH (ref 4.0–10.5)
nRBC: 0 % (ref 0.0–0.2)

## 2024-04-03 LAB — HIV ANTIBODY (ROUTINE TESTING W REFLEX): HIV Screen 4th Generation wRfx: NONREACTIVE

## 2024-04-03 MED ORDER — TAMSULOSIN HCL 0.4 MG PO CAPS
0.4000 mg | ORAL_CAPSULE | Freq: Every day | ORAL | 0 refills | Status: AC
  Filled 2024-04-03: qty 30, 30d supply, fill #0

## 2024-04-03 MED ORDER — ONDANSETRON HCL 4 MG PO TABS
4.0000 mg | ORAL_TABLET | Freq: Four times a day (QID) | ORAL | 1 refills | Status: AC | PRN
  Filled 2024-04-03 (×2): qty 12, 3d supply, fill #0

## 2024-04-03 MED ORDER — OXYBUTYNIN CHLORIDE ER 10 MG PO TB24
10.0000 mg | ORAL_TABLET | Freq: Every day | ORAL | 0 refills | Status: AC
  Filled 2024-04-03: qty 30, 30d supply, fill #0
  Filled 2024-04-03: qty 9, 9d supply, fill #0

## 2024-04-03 MED ORDER — OXYCODONE-ACETAMINOPHEN 5-325 MG PO TABS
1.0000 | ORAL_TABLET | ORAL | 0 refills | Status: AC | PRN
  Filled 2024-04-03 (×2): qty 12, 2d supply, fill #0

## 2024-04-03 NOTE — Telephone Encounter (Signed)
 Okay to push to Friday if we have availability then.  Her stent should not come out sooner than Thursday.  If we do not have any availability Friday either, then she will need to pull the stent out herself.

## 2024-04-03 NOTE — Progress Notes (Signed)
 Urology Inpatient Progress Note  Subjective: No acute events overnight. She is afebrile, VSS. White count up, 17.6.  Creatinine up, 1.04. She reports some crampy low back pain similar to menstrual cramps that she attributes to her stent.  Overall, her pain is improved compared to preop.  She is not sure if she will be able to pull her stent herself.  Anti-infectives: Anti-infectives (From admission, onward)    Start     Dose/Rate Route Frequency Ordered Stop   04/02/24 1400  ciprofloxacin  (CIPRO ) IVPB 400 mg        400 mg 200 mL/hr over 60 Minutes Intravenous 60 min pre-op 04/02/24 1201         Current Facility-Administered Medications  Medication Dose Route Frequency Provider Last Rate Last Admin   acetaminophen  (TYLENOL ) tablet 650 mg  650 mg Oral Q6H PRN Paudel, Keshab, MD   650 mg at 04/03/24 9462   Or   acetaminophen  (TYLENOL ) suppository 650 mg  650 mg Rectal Q6H PRN Paudel, Keshab, MD       ciprofloxacin  (CIPRO ) IVPB 400 mg  400 mg Intravenous 60 min Pre-Op Watt Rush, MD       enoxaparin  (LOVENOX ) injection 55 mg  0.5 mg/kg Subcutaneous Q24H Hallaji, Sheema M, RPH       morphine  (PF) 2 MG/ML injection 2 mg  2 mg Intravenous Q4H PRN Paudel, Keshab, MD   2 mg at 04/02/24 2223   ondansetron  (ZOFRAN ) tablet 4 mg  4 mg Oral Q6H PRN Roann Gouty, MD       Or   ondansetron  (ZOFRAN ) injection 4 mg  4 mg Intravenous Q6H PRN Roann Gouty, MD   4 mg at 04/02/24 2224   oxyCODONE  (Oxy IR/ROXICODONE ) immediate release tablet 5 mg  5 mg Oral Q4H PRN Paudel, Keshab, MD   5 mg at 04/02/24 1910   polyethylene glycol (MIRALAX  / GLYCOLAX ) packet 17 g  17 g Oral Daily PRN Roann Gouty, MD       Objective: Vital signs in last 24 hours: Temp:  [97.6 F (36.4 C)-98.5 F (36.9 C)] 97.6 F (36.4 C) (01/19 0750) Pulse Rate:  [60-95] 84 (01/19 0750) Resp:  [14-32] 18 (01/19 0750) BP: (120-144)/(74-88) 135/74 (01/19 0750) SpO2:  [91 %-97 %] 97 % (01/19 0750)  Intake/Output from previous  day: 01/18 0701 - 01/19 0700 In: 700 [I.V.:600; IV Piggyback:100] Out: 600 [Urine:600] Intake/Output this shift: Total I/O In: 240 [P.O.:240] Out: -   Physical Exam Vitals and nursing note reviewed.  Constitutional:      General: She is not in acute distress.    Appearance: She is not ill-appearing, toxic-appearing or diaphoretic.  HENT:     Head: Normocephalic and atraumatic.  Pulmonary:     Effort: Pulmonary effort is normal. No respiratory distress.  Skin:    General: Skin is warm and dry.  Neurological:     Mental Status: She is alert and oriented to person, place, and time.  Psychiatric:        Mood and Affect: Mood normal.        Behavior: Behavior normal.     Lab Results:  Recent Labs    04/02/24 1043 04/03/24 0641  WBC 11.5* 17.6*  HGB 10.9* 12.0  HCT 35.2* 38.6  PLT 262 350   BMET Recent Labs    04/02/24 0447 04/02/24 1043 04/03/24 0641  NA 140  --  139  K 4.0  --  4.4  CL 105  --  102  CO2  23  --  24  GLUCOSE 148*  --  232*  BUN 16  --  18  CREATININE 0.96 0.84 1.04*  CALCIUM 9.3  --  9.4   PT/INR No results for input(s): LABPROT, INR in the last 72 hours. ABG No results for input(s): PHART, HCO3 in the last 72 hours.  Invalid input(s): PCO2, PO2  Studies/Results: DG OR UROLOGY CYSTO IMAGE ([facility] ONLY) Result Date: 04/02/2024 There is no interpretation for this exam.  This order is for images obtained during a surgical procedure.  Please See Surgeries Tab for more information regarding the procedure.   CT ABDOMEN PELVIS W CONTRAST Result Date: 04/02/2024 EXAM: CT ABDOMEN AND PELVIS WITH CONTRAST 04/02/2024 07:57:42 AM TECHNIQUE: CT of the abdomen and pelvis was performed with the administration of 100 mL of iohexol  (OMNIPAQUE ) 300 MG/ML solution. Multiplanar reformatted images are provided for review. Automated exposure control, iterative reconstruction, and/or weight-based adjustment of the mA/kV was utilized to reduce the  radiation dose to as low as reasonably achievable. COMPARISON: 07/10/2023 CLINICAL HISTORY: Right sided flank pain, radiation to RLQ abd. FINDINGS: LOWER CHEST: No acute abnormality. LIVER: Subjective hepatic steatosis. GALLBLADDER AND BILE DUCTS: Gallbladder is unremarkable. No biliary ductal dilatation. SPLEEN: The spleen is within normal limits in size and appearance. PANCREAS: The pancreas is normal in size and contour without focal lesion or ductal dilatation. ADRENAL GLANDS: Normal size and morphology bilaterally. No nodule, thickening, or hemorrhage. No periadrenal stranding. KIDNEYS, URETERS AND BLADDER: Punctate stone noted in the upper pole of the left kidney. Several right kidney stones are noted. The largest is in the upper pole measuring 5 mm, image 32/2. Cyst within the upper pole of the right kidney compatible with a Bosniak class 1 lesion, measures 1.4 cm. Per consensus, no follow-up is needed for simple Bosniak type 1 and 2 renal cysts, unless the patient has a malignancy history or risk factors. There is right-sided hydronephrosis. At the right UPJ, there is a stone measuring 1.1 cm by 0.9 cm. Urinary bladder is unremarkable. GI AND BOWEL: Stomach appears normal. Injured appendix. The sigmoid colon demonstrates diverticulosis without evidence of diverticulitis. There is no bowel wall thickening, pericolonic stranding, abscess, or free air. There is no bowel obstruction. PERITONEUM AND RETROPERITONEUM: No ascites or focal fluid collections. No free air. VASCULATURE: Aorta is normal in caliber. LYMPH NODES: No lymphadenopathy. REPRODUCTIVE ORGANS: The uterus and adnexal structures are unremarkable. BONES AND SOFT TISSUES: Tiny fat-containing umbilical hernia. Asymmetric increased sclerosis within the right iliac bone measures 2.8 x 1.8 cm, image 59/2. Unchanged from 12/21/2020. IMPRESSION: 1. Right-sided hydronephrosis due to a 1.1 x 0.9 cm stone at the right UPJ. 2. Additional bilateral  nonobstructing renal calculi, largest 5 mm in the right upper pole. Electronically signed by: Waddell Calk MD 04/02/2024 08:10 AM EST RP Workstation: [id number]   Assessment & Plan: 39 y.o. female with PMH nephrolithiasis now POD 1 from right ureteroscopy with laser lithotripsy and stent placement with Dr. Watt.  She tolerated her stent with some discomfort.  I have sent in oxybutynin  and Flomax  to help manage these.  Per Dr. Watt, stent (on a tether) can come out this Thursday, 04/06/2024.  We will call her after discharge and offer her in-office removal if desired.  She will need outpatient follow-up with us  in about a month with RUS prior.  Valary Manahan, PA-C 04/03/2024

## 2024-04-03 NOTE — Plan of Care (Signed)
" °  Problem: Education: Goal: Knowledge of the prescribed therapeutic regimen will improve Outcome: Progressing   Problem: Respiratory: Goal: Will regain and/or maintain adequate ventilation Outcome: Progressing   Problem: Skin Integrity: Goal: Demonstrates signs of wound healing without infection Outcome: Progressing   "

## 2024-04-03 NOTE — Progress Notes (Signed)
 Pt discharge completed by this RN.  Per pt, she is waiting on application from case management/social work.  Pt also states that her pharmacy is closed for the holiday and current discharge plan has 2 meds going to her pharmacy.  Messaged both Djan, MD, to fix medication orders and [facility] pharmacy in regards to delivery of meds who are waiting on new order from MD.  This RN also messaged Annabella Gin reference application who stated she would not be able to get back for an hour.  Email obtained from pt and sent for application to be emailed.  Awaiting MD to send over new scripts to o/p pharmacy for pt to be discharged.  Pt updated on same.

## 2024-04-03 NOTE — Telephone Encounter (Signed)
 She is postop from right ureteroscopy with Dr. Watt over the weekend.  She is discharging today.  Please call her later today to let her know that her stent can come out this Thursday, 04/06/2024.  She may prefer to have us  remove it for her in clinic, okay to schedule her an office visit for this if she desires.  Please also schedule her for 1 month postop follow-up with us  with renal ultrasound prior.

## 2024-04-03 NOTE — TOC CM/SW Note (Signed)
 Transition of Care [facility]) - Inpatient Brief Assessment   Patient Details  Name: Toni Salazar MRN: [id number] Date of Birth: 27-Sep-1985  [facility]) CM/SW Contact:    Nathanael CHRISTELLA Ring, RN Phone Number: 04/03/2024, 10:38 AM   Clinical Narrative: CM met with patient at the bedside, she has concerns about how she will pay her hospital bill.  She is a single mom of a child with disabilities.  Her son has Medicaid but she does not qualify.  She reports that her health insurance does not have good coverage and she has tried to do payment plans in the past but then found that [facility] still sent her bill to collections.  Reached out to the financial counselor Annabella Gin, she will bring the patient a financial assistance application that she can fill out after discharge.   No other TOC needs identified at this time.  Food resources added to the AVS.     Transition of Care Asessment: Insurance and Status: Insurance coverage has been reviewed Patient has primary care physician: Yes Home environment has been reviewed: From home with son Prior level of function:: independent Prior/Current Home Services: No current home services Social Drivers of Health Review: SDOH reviewed interventions complete Readmission risk has been reviewed: No (under observation) Transition of care needs: transition of care needs identified, TOC will continue to follow

## 2024-04-03 NOTE — Discharge Summary (Signed)
 " Physician Discharge Summary   Patient: Toni Salazar MRN: [id number] DOB: 05/27/1985  Admit date:     04/02/2024  Discharge date: 04/03/24  Discharge Physician: Drue ONEIDA Potter   PCP: Gretta Comer POUR, NP   Recommendations at discharge:  Follow-up with urologist Discharge Diagnoses: Principal Problem:   Hydronephrosis Active Problems:   Status post surgery  Resolved Problems:   * No resolved hospital problems. [facility] Hospital Course:  From HPI Toni Salazar is a pleasant 39 y.o. female with medical history significant for urolithiasis with prior endoscopic management in 2019 and 21 who presented to ED complaining of right flank pain and nausea without fever or voiding symptoms.  Patient stated that her pain was 9/10 in intensity, sharp, associate with right back pain.  She denied any fever, chills, dysuria.   ED Course: Upon arrival to the ED, patient is found to have positive UA which showed RBCs and CT scan of the abdomen demonstrated 1 cm right UPJ stone with bilateral renal stones with the largest 5 mm in the right kidney.  She has a very mild leukocytosis.  Urology was consulted who will come and see the patient.    Patient was seen by urologist and underwent cystoscopy with right retrograde pyelogram and interpretation.  Right ureteroscopy with holmium laser application, stone extraction and placement of right double-J stent. Patient has been cleared by urologist for discharge today will follow-up as an outpatient  Consultants: Urology Procedures performed: As mentioned above Disposition: Home Diet recommendation:  Cardiac diet DISCHARGE MEDICATION: Allergies as of 04/03/2024       Reactions   Amoxicillin Hives, Swelling   Has patient had a PCN reaction causing immediate rash, facial/tongue/throat swelling, SOB or lightheadedness with hypotension: No Has patient had a PCN reaction causing severe rash involving mucus membranes or skin necrosis: No Has patient had a PCN  reaction that required hospitalization: No Has patient had a PCN reaction occurring within the last 10 years: Yes If all of the above answers are NO, then may proceed with Cephalosporin use.   Sulfa Antibiotics Hives        Medication List     STOP taking these medications    ketorolac  10 MG tablet Commonly known as: TORADOL    ondansetron  8 MG disintegrating tablet Commonly known as: ZOFRAN -ODT       TAKE these medications    ibuprofen 200 MG tablet Commonly known as: ADVIL Take 200 mg by mouth every 6 (six) hours as needed for fever, headache or mild pain (pain score 1-3).   ondansetron  4 MG tablet Commonly known as: ZOFRAN  Take 1 tablet (4 mg total) by mouth 4 (four) times daily as needed for nausea or vomiting.   oxybutynin  10 MG 24 hr tablet Commonly known as: DITROPAN -XL Take 1 tablet (10 mg total) by mouth daily.   oxyCODONE -acetaminophen  5-325 MG tablet Commonly known as: Percocet Take 1 tablet by mouth every 4 (four) hours as needed for severe pain (pain score 7-10).   tamsulosin  0.4 MG Caps capsule Commonly known as: FLOMAX  Take 1 capsule (0.4 mg total) by mouth daily.        Follow-up Information     [facility] [place] Follow up.   Specialty: Urology Why: Please call to arrange a follow up vist for 1 - 2 weeks from now. Contact information: [address], [address] [place]  [phone]  Discharge Exam: Filed Weights   04/02/24 0436  Weight: 107.5 kg   Respiratory: Clear to auscultation B/L, no wheezing, no rales.  Cardiovascular: Regular rate and rhythm, no murmurs / rubs / gallops. No extremity edema. 2+ pedal pulses. No carotid bruits.  Abdomen: Soft, no tenderness, Bowel sounds positive.  Right CVA tenderness. Musculoskeletal: no clubbing / cyanosis. Good ROM, no contractures. Normal muscle tone.  Skin: no rashes, lesions, ulcers. Neurologic: CN 2-12 grossly intact.  Sensation intact, No focal deficit identified Psychiatric: Alert and oriented x 3. Normal mood.      Condition at discharge: good  The results of significant diagnostics from this hospitalization (including imaging, microbiology, ancillary and laboratory) are listed below for reference.   Imaging Studies: DG OR UROLOGY CYSTO IMAGE ([facility] ONLY) Result Date: 04/02/2024 There is no interpretation for this exam.  This order is for images obtained during a surgical procedure.  Please See Surgeries Tab for more information regarding the procedure.   CT ABDOMEN PELVIS W CONTRAST Result Date: 04/02/2024 EXAM: CT ABDOMEN AND PELVIS WITH CONTRAST 04/02/2024 07:57:42 AM TECHNIQUE: CT of the abdomen and pelvis was performed with the administration of 100 mL of iohexol  (OMNIPAQUE ) 300 MG/ML solution. Multiplanar reformatted images are provided for review. Automated exposure control, iterative reconstruction, and/or weight-based adjustment of the mA/kV was utilized to reduce the radiation dose to as low as reasonably achievable. COMPARISON: 07/10/2023 CLINICAL HISTORY: Right sided flank pain, radiation to RLQ abd. FINDINGS: LOWER CHEST: No acute abnormality. LIVER: Subjective hepatic steatosis. GALLBLADDER AND BILE DUCTS: Gallbladder is unremarkable. No biliary ductal dilatation. SPLEEN: The spleen is within normal limits in size and appearance. PANCREAS: The pancreas is normal in size and contour without focal lesion or ductal dilatation. ADRENAL GLANDS: Normal size and morphology bilaterally. No nodule, thickening, or hemorrhage. No periadrenal stranding. KIDNEYS, URETERS AND BLADDER: Punctate stone noted in the upper pole of the left kidney. Several right kidney stones are noted. The largest is in the upper pole measuring 5 mm, image 32/2. Cyst within the upper pole of the right kidney compatible with a Bosniak class 1 lesion, measures 1.4 cm. Per consensus, no follow-up is needed for simple Bosniak type 1 and 2  renal cysts, unless the patient has a malignancy history or risk factors. There is right-sided hydronephrosis. At the right UPJ, there is a stone measuring 1.1 cm by 0.9 cm. Urinary bladder is unremarkable. GI AND BOWEL: Stomach appears normal. Injured appendix. The sigmoid colon demonstrates diverticulosis without evidence of diverticulitis. There is no bowel wall thickening, pericolonic stranding, abscess, or free air. There is no bowel obstruction. PERITONEUM AND RETROPERITONEUM: No ascites or focal fluid collections. No free air. VASCULATURE: Aorta is normal in caliber. LYMPH NODES: No lymphadenopathy. REPRODUCTIVE ORGANS: The uterus and adnexal structures are unremarkable. BONES AND SOFT TISSUES: Tiny fat-containing umbilical hernia. Asymmetric increased sclerosis within the right iliac bone measures 2.8 x 1.8 cm, image 59/2. Unchanged from 12/21/2020. IMPRESSION: 1. Right-sided hydronephrosis due to a 1.1 x 0.9 cm stone at the right UPJ. 2. Additional bilateral nonobstructing renal calculi, largest 5 mm in the right upper pole. Electronically signed by: Waddell Calk MD 04/02/2024 08:10 AM EST RP Workstation: [id number]    Microbiology: Results for orders placed or performed during the hospital encounter of 12/21/20  Blood culture (routine single)     Status: None   Collection Time: 12/21/20  3:51 PM   Specimen: BLOOD  Result Value Ref Range Status   Specimen Description BLOOD LEFT ANTECUBITAL  Final  Special Requests   Final    BOTTLES DRAWN AEROBIC AND ANAEROBIC Blood Culture adequate volume   Culture   Final    NO GROWTH 5 DAYS Performed at [facility], [address] [place] [place], [place] [address]    Report Status 12/26/2020 FINAL  Final  Urine Culture     Status: Abnormal   Collection Time: 12/21/20  3:51 PM   Specimen: In/Out Cath Urine  Result Value Ref Range Status   Specimen Description   Final    IN/OUT CATH URINE Performed at [facility], [address] [place], [place] [address]    Special Requests   Final    NONE Performed at [facility], [address] [place], [place] [address]    Culture (A)  Final    60,000 COLONIES/mL MULTIPLE SPECIES PRESENT, SUGGEST RECOLLECTION   Report Status 12/23/2020 FINAL  Final    Labs: CBC: Recent Labs  Lab 04/02/24 0447 04/02/24 1043 04/03/24 0641  WBC 14.4* 11.5* 17.6*  HGB 12.2 10.9* 12.0  HCT 39.0 35.2* 38.6  MCV 85.9 86.5 88.1  PLT 298 262 350   Basic Metabolic Panel: Recent Labs  Lab 04/02/24 0447 04/02/24 1043 04/03/24 0641  NA 140  --  139  K 4.0  --  4.4  CL 105  --  102  CO2 23  --  24  GLUCOSE 148*  --  232*  BUN 16  --  18  CREATININE 0.96 0.84 1.04*  CALCIUM 9.3  --  9.4   Liver Function Tests: Recent Labs  Lab 04/02/24 0447  AST 27  ALT 25  ALKPHOS 103  BILITOT 0.4  PROT 7.4  ALBUMIN 4.2   CBG: No results for input(s): GLUCAP in the last 168 hours.  Discharge time spent:  37 minutes.  Signed: Drue ONEIDA Potter, MD [facility] 04/03/2024 "

## 2024-04-04 NOTE — Progress Notes (Unsigned)
" ° °  04/07/2024 3:27 PM   ESTLE SABELLA February 08, 1986 [id number]  Cystoscopy Procedure Note:  Indication: ***  After informed consent and discussion of the procedure and its risks, Toni Salazar was positioned and prepped in the standard fashion. Cystoscopy was performed with a flexible cystoscope. The external vaginal anatomy, urethra, bladder neck and bladder mucosa were visualized in a systematic fashion. The ureteral orifices were noted in orthotopic location and orientation. There were no bladder mucosal lesions, stones, debris or anatomic variants noted.   Imaging: Recent {bglistimagingtype:33376} reviewed - no GU abnormalities noted  Findings: ***  Assessment and Plan: ***  Toni Skye, MD 04/04/2024   "

## 2024-04-04 NOTE — Anesthesia Postprocedure Evaluation (Signed)
"   Anesthesia Post Note  Patient: Toni Salazar  Procedure(s) Performed: CYSTOSCOPY/URETEROSCOPY/HOLMIUM LASER/STENT PLACEMENT (Right)  Patient location during evaluation: PACU Anesthesia Type: General Level of consciousness: awake and alert Pain management: pain level controlled Vital Signs Assessment: post-procedure vital signs reviewed and stable Respiratory status: spontaneous breathing, nonlabored ventilation, respiratory function stable and patient connected to nasal cannula oxygen Cardiovascular status: blood pressure returned to baseline and stable Postop Assessment: no apparent nausea or vomiting Anesthetic complications: no   No notable events documented.   Last Vitals:  Vitals:   04/03/24 0508 04/03/24 0750  BP: (!) 144/88 135/74  Pulse: 95 84  Resp: 20 18  Temp: 36.7 C 36.4 C  SpO2: 92% 97%    Last Pain:  Vitals:   04/03/24 1000  TempSrc:   PainSc: 0-No pain                 [name]      "

## 2024-04-07 ENCOUNTER — Ambulatory Visit: Admitting: Urology

## 2024-04-07 ENCOUNTER — Encounter: Admitting: Urology

## 2024-04-07 DIAGNOSIS — Z9889 Other specified postprocedural states: Secondary | ICD-10-CM

## 2024-04-07 NOTE — Progress Notes (Unsigned)
 Pt here for stent removal with string. Pt positioned in lithotomy and string located. Gentle firm pressure applied and stent was removed. A 24 cm stent was removed with string intact. Pt tolerated well. Instructed to continue antiinflammatories to help ease discomfort and to continue to keep hydrated. Went over symptoms of infection and when to go to ER. Pt voiced understanding.   Mathew Pinal, RN  Follow up as needed.
